# Patient Record
Sex: Female | Born: 1935
Health system: Southern US, Community
[De-identification: ages and names within clinical notes are randomized; demographics above are authoritative.]

## PROBLEM LIST (undated history)

## (undated) DIAGNOSIS — R011 Cardiac murmur, unspecified: Secondary | ICD-10-CM

## (undated) DIAGNOSIS — M25569 Pain in unspecified knee: Secondary | ICD-10-CM

## (undated) DIAGNOSIS — D219 Benign neoplasm of connective and other soft tissue, unspecified: Secondary | ICD-10-CM

## (undated) DIAGNOSIS — M722 Plantar fascial fibromatosis: Secondary | ICD-10-CM

## (undated) DIAGNOSIS — J449 Chronic obstructive pulmonary disease, unspecified: Secondary | ICD-10-CM

## (undated) DIAGNOSIS — R918 Other nonspecific abnormal finding of lung field: Secondary | ICD-10-CM

## (undated) DIAGNOSIS — K219 Gastro-esophageal reflux disease without esophagitis: Secondary | ICD-10-CM

## (undated) DIAGNOSIS — R911 Solitary pulmonary nodule: Secondary | ICD-10-CM

## (undated) DIAGNOSIS — Z8719 Personal history of other diseases of the digestive system: Secondary | ICD-10-CM

## (undated) DIAGNOSIS — M1990 Unspecified osteoarthritis, unspecified site: Secondary | ICD-10-CM

## (undated) DIAGNOSIS — E7800 Pure hypercholesterolemia, unspecified: Secondary | ICD-10-CM

## (undated) DIAGNOSIS — Z79899 Other long term (current) drug therapy: Secondary | ICD-10-CM

## (undated) DIAGNOSIS — Z8709 Personal history of other diseases of the respiratory system: Secondary | ICD-10-CM

## (undated) DIAGNOSIS — E039 Hypothyroidism, unspecified: Secondary | ICD-10-CM

## (undated) DIAGNOSIS — N898 Other specified noninflammatory disorders of vagina: Principal | ICD-10-CM

## (undated) DIAGNOSIS — K146 Glossodynia: Secondary | ICD-10-CM

## (undated) DIAGNOSIS — N952 Postmenopausal atrophic vaginitis: Principal | ICD-10-CM

## (undated) DIAGNOSIS — G43109 Migraine with aura, not intractable, without status migrainosus: Secondary | ICD-10-CM

## (undated) DIAGNOSIS — N951 Menopausal and female climacteric states: Secondary | ICD-10-CM

## (undated) DIAGNOSIS — R042 Hemoptysis: Secondary | ICD-10-CM

## (undated) HISTORY — DX: Chronic obstructive pulmonary disease, unspecified: J44.9

## (undated) HISTORY — DX: Menopausal and female climacteric states: N95.1

## (undated) HISTORY — DX: Plantar fascial fibromatosis: M72.2

## (undated) HISTORY — DX: Pain in unspecified knee: M25.569

## (undated) HISTORY — DX: Gastro-esophageal reflux disease without esophagitis: K21.9

## (undated) HISTORY — DX: Postmenopausal atrophic vaginitis: N95.2

## (undated) HISTORY — PX: COLONOSCOPY: SHX174

## (undated) HISTORY — DX: Glossodynia: K14.6

## (undated) HISTORY — DX: Pure hypercholesterolemia, unspecified: E78.00

## (undated) HISTORY — DX: Other specified noninflammatory disorders of vagina: N89.8

## (undated) HISTORY — DX: Benign neoplasm of connective and other soft tissue, unspecified: D21.9

## (undated) HISTORY — PX: UPPER GASTROINTESTINAL ENDOSCOPY: SHX188

## (undated) HISTORY — DX: Solitary pulmonary nodule: R91.1

## (undated) HISTORY — DX: Hypothyroidism, unspecified: E03.9

## (undated) HISTORY — PX: EYE SURGERY: SHX253

## (undated) HISTORY — DX: Other long term (current) drug therapy: Z79.899

---

## 1978-05-28 HISTORY — PX: ABDOMINAL HYSTERECTOMY: SHX81

## 1989-01-26 HISTORY — PX: CATARACT EXTRACTION W/ INTRAOCULAR LENS  IMPLANT, BILATERAL: SHX1307

## 2000-09-05 ENCOUNTER — Encounter: Payer: Self-pay | Admitting: Internal Medicine

## 2000-09-05 ENCOUNTER — Ambulatory Visit (HOSPITAL_COMMUNITY): Admission: RE | Admit: 2000-09-05 | Discharge: 2000-09-05 | Payer: Self-pay | Admitting: Internal Medicine

## 2000-11-07 ENCOUNTER — Encounter: Payer: Self-pay | Admitting: Otolaryngology

## 2000-11-07 ENCOUNTER — Ambulatory Visit (HOSPITAL_COMMUNITY): Admission: RE | Admit: 2000-11-07 | Discharge: 2000-11-07 | Payer: Self-pay | Admitting: Otolaryngology

## 2001-01-03 ENCOUNTER — Ambulatory Visit (HOSPITAL_COMMUNITY): Admission: RE | Admit: 2001-01-03 | Discharge: 2001-01-03 | Payer: Self-pay | Admitting: Internal Medicine

## 2001-01-10 ENCOUNTER — Ambulatory Visit (HOSPITAL_COMMUNITY): Admission: RE | Admit: 2001-01-10 | Discharge: 2001-01-10 | Payer: Self-pay | Admitting: Internal Medicine

## 2001-01-10 ENCOUNTER — Encounter (INDEPENDENT_AMBULATORY_CARE_PROVIDER_SITE_OTHER): Payer: Self-pay | Admitting: Internal Medicine

## 2001-04-29 ENCOUNTER — Ambulatory Visit (HOSPITAL_COMMUNITY): Admission: RE | Admit: 2001-04-29 | Discharge: 2001-04-29 | Payer: Self-pay | Admitting: Internal Medicine

## 2001-04-29 ENCOUNTER — Encounter: Payer: Self-pay | Admitting: Internal Medicine

## 2002-06-02 ENCOUNTER — Encounter: Payer: Self-pay | Admitting: Internal Medicine

## 2002-06-02 ENCOUNTER — Ambulatory Visit (HOSPITAL_COMMUNITY): Admission: RE | Admit: 2002-06-02 | Discharge: 2002-06-02 | Payer: Self-pay | Admitting: Internal Medicine

## 2002-06-29 ENCOUNTER — Ambulatory Visit (HOSPITAL_COMMUNITY): Admission: RE | Admit: 2002-06-29 | Discharge: 2002-06-29 | Payer: Self-pay | Admitting: Ophthalmology

## 2002-12-03 ENCOUNTER — Ambulatory Visit (HOSPITAL_COMMUNITY): Admission: RE | Admit: 2002-12-03 | Discharge: 2002-12-03 | Payer: Self-pay | Admitting: Internal Medicine

## 2002-12-15 ENCOUNTER — Encounter: Payer: Self-pay | Admitting: Internal Medicine

## 2002-12-15 ENCOUNTER — Ambulatory Visit (HOSPITAL_COMMUNITY): Admission: RE | Admit: 2002-12-15 | Discharge: 2002-12-15 | Payer: Self-pay | Admitting: Internal Medicine

## 2003-06-14 ENCOUNTER — Ambulatory Visit (HOSPITAL_COMMUNITY): Admission: RE | Admit: 2003-06-14 | Discharge: 2003-06-14 | Payer: Self-pay | Admitting: Internal Medicine

## 2004-06-15 ENCOUNTER — Ambulatory Visit (HOSPITAL_COMMUNITY): Admission: RE | Admit: 2004-06-15 | Discharge: 2004-06-15 | Payer: Self-pay | Admitting: Internal Medicine

## 2004-08-24 ENCOUNTER — Ambulatory Visit: Payer: Self-pay | Admitting: Internal Medicine

## 2005-01-16 ENCOUNTER — Ambulatory Visit: Payer: Self-pay | Admitting: Internal Medicine

## 2005-01-16 ENCOUNTER — Encounter (INDEPENDENT_AMBULATORY_CARE_PROVIDER_SITE_OTHER): Payer: Self-pay | Admitting: Internal Medicine

## 2005-01-16 ENCOUNTER — Ambulatory Visit (HOSPITAL_COMMUNITY): Admission: RE | Admit: 2005-01-16 | Discharge: 2005-01-16 | Payer: Self-pay | Admitting: Internal Medicine

## 2005-05-28 HISTORY — PX: BLADDER SUSPENSION: SHX72

## 2005-05-28 HISTORY — PX: CYSTOCELE REPAIR: SHX163

## 2005-07-02 ENCOUNTER — Ambulatory Visit (HOSPITAL_COMMUNITY): Admission: RE | Admit: 2005-07-02 | Discharge: 2005-07-02 | Payer: Self-pay | Admitting: Internal Medicine

## 2005-08-22 ENCOUNTER — Inpatient Hospital Stay (HOSPITAL_COMMUNITY): Admission: RE | Admit: 2005-08-22 | Discharge: 2005-08-24 | Payer: Self-pay | Admitting: Obstetrics and Gynecology

## 2006-02-07 ENCOUNTER — Ambulatory Visit: Payer: Self-pay | Admitting: Internal Medicine

## 2006-07-08 ENCOUNTER — Ambulatory Visit (HOSPITAL_COMMUNITY): Admission: RE | Admit: 2006-07-08 | Discharge: 2006-07-08 | Payer: Self-pay | Admitting: Internal Medicine

## 2006-08-13 ENCOUNTER — Ambulatory Visit (HOSPITAL_COMMUNITY): Admission: RE | Admit: 2006-08-13 | Discharge: 2006-08-13 | Payer: Self-pay | Admitting: Internal Medicine

## 2006-08-29 ENCOUNTER — Other Ambulatory Visit: Admission: RE | Admit: 2006-08-29 | Discharge: 2006-08-29 | Payer: Self-pay | Admitting: Obstetrics and Gynecology

## 2007-07-15 ENCOUNTER — Ambulatory Visit (HOSPITAL_COMMUNITY): Admission: RE | Admit: 2007-07-15 | Discharge: 2007-07-15 | Payer: Self-pay | Admitting: Internal Medicine

## 2007-10-13 ENCOUNTER — Encounter (HOSPITAL_COMMUNITY): Admission: RE | Admit: 2007-10-13 | Discharge: 2007-11-12 | Payer: Self-pay | Admitting: Orthopaedic Surgery

## 2008-07-19 ENCOUNTER — Ambulatory Visit (HOSPITAL_COMMUNITY): Admission: RE | Admit: 2008-07-19 | Discharge: 2008-07-19 | Payer: Self-pay | Admitting: Internal Medicine

## 2009-01-04 ENCOUNTER — Encounter: Payer: Self-pay | Admitting: Obstetrics and Gynecology

## 2009-01-04 ENCOUNTER — Other Ambulatory Visit: Admission: RE | Admit: 2009-01-04 | Discharge: 2009-01-04 | Payer: Self-pay | Admitting: Obstetrics and Gynecology

## 2009-01-04 ENCOUNTER — Ambulatory Visit: Payer: Self-pay | Admitting: Obstetrics and Gynecology

## 2009-01-11 ENCOUNTER — Ambulatory Visit: Payer: Self-pay | Admitting: Obstetrics and Gynecology

## 2009-08-11 ENCOUNTER — Ambulatory Visit (HOSPITAL_COMMUNITY): Admission: RE | Admit: 2009-08-11 | Discharge: 2009-08-11 | Payer: Self-pay | Admitting: Internal Medicine

## 2010-01-05 ENCOUNTER — Ambulatory Visit: Payer: Self-pay | Admitting: Obstetrics and Gynecology

## 2010-01-17 ENCOUNTER — Ambulatory Visit: Payer: Self-pay | Admitting: Obstetrics and Gynecology

## 2010-05-09 ENCOUNTER — Ambulatory Visit: Payer: Self-pay | Admitting: Internal Medicine

## 2010-08-15 ENCOUNTER — Other Ambulatory Visit (HOSPITAL_COMMUNITY): Payer: Self-pay | Admitting: Internal Medicine

## 2010-08-15 DIAGNOSIS — Z139 Encounter for screening, unspecified: Secondary | ICD-10-CM

## 2010-08-17 ENCOUNTER — Ambulatory Visit (HOSPITAL_COMMUNITY)
Admission: RE | Admit: 2010-08-17 | Discharge: 2010-08-17 | Disposition: A | Discharge status: Home / Self Care | Payer: Medicare Other | Source: Ambulatory Visit | Attending: Internal Medicine | Admitting: Internal Medicine

## 2010-08-17 DIAGNOSIS — Z1231 Encounter for screening mammogram for malignant neoplasm of breast: Secondary | ICD-10-CM | POA: Insufficient documentation

## 2010-08-17 DIAGNOSIS — Z139 Encounter for screening, unspecified: Secondary | ICD-10-CM

## 2010-10-13 NOTE — Op Note (Signed)
NAMEZULEICA, SEITH               ACCOUNT NO.:  [id number]   MEDICAL RECORD NO.:  [id number]          PATIENT TYPE:  INP   LOCATION:  1011                         FACILITY:  [facility]   PHYSICIAN:  Ronald L. Earlene Plater, M.D.  DATE OF BIRTH:  January 12, 1935   DATE OF PROCEDURE:  08/22/2005  DATE OF DISCHARGE:                                 OPERATIVE REPORT   DIAGNOSIS:  Stress urinary incontinence, intrinsic sphincter deficiency,  type 3.  Low leak point pressure.   OPERATIVE PROCEDURE:  Placement of a [employer] suprapubic  sling.   SURGEON:  Lucrezia Starch. Earlene Plater, M.D.   ASSISTANT:  Rande Brunt. Eda Paschal, M.D.   ANESTHESIA:  General endotracheal.   ESTIMATED BLOOD LOSS:  50 cc.   TUBES:  A 16 French Foley.   COMPLICATIONS:  None.   INDICATIONS FOR PROCEDURE:  Ms. Roberson is a lovely 76 year old white female  who presented with urinary incontinence and a cystocele.  She had a  significant cystocele that required repair.  Also had atrophic vaginitis.  Patient underwent urodynamics, which revealed a stable detrusor but had a  very low leak point pressure, signifying type 3 stress urinary incontinence.  After understanding risks, benefits and alternatives, she elected to proceed  with a sling procedure.  Dr. Eda Paschal was to perform the cystocele  concomitantly.   PROCEDURE IN DETAIL:  The patient was placed in a supine position.  After  proper general endotracheal anesthesia, was placed in the dorsal lithotomy  position.  Dr. Eda Paschal then proceeded with a cystocele repair, to be  dictated separately.  Following this, before closure of the mucosa, needles  were placed suprapubically one fingerbreadth superiorly and two  fingerbreadths lateral to the symphysis pubis midline and the punches were  carried down through the endopelvic fascia bilaterally.  A cystourethroscopy  was then performed.  The bladder was distended.  With the 70 degree lens, it  was carefully inspected, and there  were no perforations noted.  The [place]  sling was then placed into position and under the proper tension it was  visualized and felt to be in good position.  Recystoscopy was performed, and  again, no perforations were noted.  The sling material was cut at the skin  level, and the area was Dermabond closed, and turned back over to Dr.  Eda Paschal for closure of the vaginal incision.      Ronald L. Earlene Plater, M.D.  Electronically Signed    RLD/MEDQ  D:  08/22/2005  T:  08/23/2005  Job:  [id number]

## 2010-10-13 NOTE — H&P (Signed)
[name], [name]               ACCOUNT NO.:  [id number]   MEDICAL RECORD NO.:  [id number]          PATIENT TYPE:  INP   LOCATION:  NA                           FACILITY:  [facility]   PHYSICIAN:  Daniel L. Gottsegen, M.D.DATE OF BIRTH:  [birth year]   DATE OF ADMISSION:  DATE OF DISCHARGE:                                HISTORY & PHYSICAL   DATE OF SURGERY:  Wednesday, August 22, 2005 at 8:30 A.M. at [facility].   CHIEF COMPLAINT:  Cystocele with urinary stress incontinence.   HISTORY OF PRESENT ILLNESS:  The patient is a 76 year old gravida 2, para 2,  AB 0 who had presented to [name]'s office with a history of urinary  stress incontinence that occurred with coughing, laughing and sneezing.  She  had tried a variety of muscarinic drugs such as Detrol and Ditropan as well  as Kegel exercises but the problem has continued to get worse.  In June of  2006 she started to notice a large bulge as well.  The bulge bothers her,  although not quite as much as the incontinence.  She is having no other  vaginal symptoms.  She has been assessed, both by [name] and myself, for  the above and now enters the hospital for repair of her cystocele as well as  a sling procedure for correction of her urinary stress incontinence.   PAST MEDICAL HISTORY:  1.  Reveals a total abdominal hysterectomy in 1979 for fibroids in      [place].  2.  She had cataract surgery in 2000 and 2004 in [place].  3.  She is hypothyroid.  4.  She has gastroesophageal reflux disease.   CURRENT MEDICATIONS:  She takes:  1.  Synthroid 75 mcg daily.  2.  Nexium 40 mg daily.  3.  Nasonex for allergies.  4.  Estraderm 0.1 mg patch weekly.  5.  Calcium.  6.  Multivitamins.   ALLERGIES:  She is allergic to no drugs.   FAMILY HISTORY:  Reveals that her mother had uterine cancer but is otherwise  noncontributory.   SOCIAL HISTORY:  She is a nonsmoker, nondrinker.   REVIEW OF SYSTEMS:  GENERAL:   Normal.  SKIN AND BREASTS:  Normal.  ENT:  Normal.  CARDIOVASCULAR:  Negative.  RESPIRATORY:  Negative.  GASTROINTESTINAL:  Reveals a history of gastroesophageal reflux disease.  MUSCULOSKELETAL:  Negative. GENITOURINARY:  See above.  NEUROLOGICAL/PSYCHIATRIC:  Negative.  ALLERGIC/IMMUNOLOGICAL/LYMPHATIC/ENDOCRINE:  Reveals a history of allergies  on Nasonex and hypothyroidism on Synthroid.   PHYSICAL EXAMINATION:  GENERAL APPEARANCE:  Patient is a well-developed,  well-nourished female in no acute distress.  VITAL SIGNS:  Blood pressure is 116/74, pulse 80 and regular, respirations  16 and nonlabored.  She is afebrile.  HEENT:  All within normal limits.  NECK:  Supple.  Trachea in midline.  Thyroid is not enlarged.  LUNGS:  Clear to auscultation and percussion.  HEART:  No thrills, heaves or murmurs.  BREASTS:  No masses.  ABDOMEN:  Soft without guarding, rebound or masses.  PELVIC:  External genitalia is normal. BUS  is normal. Bladder reveals a  third degree cystocele with loss of urethral-vesical angle.  Vagina is well  supported without enterocele or rectocele.  Cervix and uterus are absent.  Adnexa fail to reveal masses.  Anus and perineum normal.  Digital rectal  examination is negative.   ADMISSION IMPRESSION:  1.  Significant cystocele.  2.  Urinary stress incontinence.   PLAN:  See above.      Daniel L. Eda Paschal, M.D.  Electronically Signed     DLG/MEDQ  D:  08/21/2005  T:  08/21/2005  Job:  [id number]

## 2010-10-13 NOTE — Op Note (Signed)
NAME:  [name], [name]                         ACCOUNT NO.:  [id number]   MEDICAL RECORD NO.:  [id number]                   PATIENT TYPE:  AMB   LOCATION:  DAY                                  FACILITY:  [facility]   PHYSICIAN:  Lionel December, M.D.                 DATE OF BIRTH:  Jan 21, 1935   DATE OF PROCEDURE:  12/03/2002  DATE OF DISCHARGE:                                 OPERATIVE REPORT   PROCEDURE:  Esophagogastroduodenoscopy with esophageal dilatation followed  by colonoscopy.   INDICATIONS FOR PROCEDURE:  [name] is a 76 year old Caucasian female with  chronic GERD who also has short-segment [name]'s esophagus resulting in  solid food dysphagia.  She is on antireflux measures, Aciphex, p.r.n. use of  Tums for control of her GERD symptoms.  She is also undergoing screening  colonoscopy.  Her last EGD was in August of 2002.  The procedure was  reviewed with the patient, and informed consent was obtained.   PREOPERATIVE MEDICATIONS:  Cetacaine spray for pharyngeal topical  anesthesia, Demerol 25 mg IV, Versed 10 mg IV in divided doses.   FINDINGS:  The procedure was performed in the endoscopy suite.  The  patient's vital signs and O2 saturations were monitored during the procedure  and remained stable.   PROCEDURE #1 - ESOPHAGOGASTRODUODENOSCOPY:  The patient was placed in the  left lateral recumbent position.  The endoscope was passed via the  oropharynx without any difficulty into the esophagus.   Esophagus:  The mucosa of the esophagus was normal, except distally there  were two islands of gastric-type mucosa and a 15-mm tongue of [name]'s  mucosa at the proximal GE junction.  She had a ring at the GE junction and a  3-cm size sliding hiatal hernia.   Stomach:  It was empty and distended very well with insufflation.  The folds  of the proximal stomach were normal.  Examination of the mucosa at the body,  antrum, pyloric channel, as well as angularis, fundus, and cardia  was  normal.   Duodenum:  Examination of the bulb and second part of the duodenum was  normal.  The endoscope was withdrawn.   The esophagus was dilated by passing a 54 [place] Maloney dilator.  As  dilation was continued, the endoscope was passed again, and there was a  linear boat-shaped tear at the mid proximal esophagus,  implying tubular  narrowing in this segment which was not obvious initially.  There was a  small tear at the GE junction as well.  The endoscope was withdrawn, and the  patient was prepared for procedure #2.   PROCEDURE #2 - COLONOSCOPY:  Rectal examination was performed.  No  abnormality noted on external or digital exam.  The scope was placed into  the rectum and advanced to the region of the sigmoid colon and beyond.  The  preparation was satisfactory.  She had a few  small diverticula at the  sigmoid colon.  The scope was passed to the cecum which was identified by  the appendiceal orifice and ileocecal valve.  As the scope was withdrawn,  the colonic mucosa was once again carefully examined.  There were no polyps  or other abnormalities.  The rectal mucosa similarly was normal.  The scope  was retroflexed to examine the anorectal junction which was unremarkable.  The endoscope was straightened and withdrawn.  The patient tolerated the  procedure well.   FINAL DIAGNOSES:  1. Single tract of [name]'s-type mucosa at 15 mm in length along with two     islands of gastric-type mucosa.  No biopsies taken today.  2. Distal esophageal ring and tubular narrowing to the esophagus which was     more apparent after esophageal dilatation which resulted in tearing of     the proximal to mid segment as well as gastroesophageal junction.  3. Normal examination of the stomach and second part of the duodenum.  4. Normal colonoscopy, except for a few small diverticula at sigmoid colon.   RECOMMENDATIONS:  1. Soft diet for two days.  2. She will continue antireflux measures  and Aciphex as before.  3. High fiber diet.  4. She will return for office visit in one year from now.                                               Lionel December, M.D.    NR/MEDQ  D:  12/03/2002  T:  12/03/2002  Job:  [id number]   cc:   Kingsley Callander. Ouida Sills, M.D.  [address]  [place]  [place] [address]  Fax: [phone]

## 2010-10-13 NOTE — Op Note (Signed)
[name], [name]               ACCOUNT NO.:  [id number]   MEDICAL RECORD NO.:  [id number]          PATIENT TYPE:  INP   LOCATION:  1011                         FACILITY:  [facility]   PHYSICIAN:  Daniel L. Gottsegen, M.D.DATE OF BIRTH:  1934/11/30   DATE OF PROCEDURE:  08/22/2005  DATE OF DISCHARGE:                                 OPERATIVE REPORT   PRE-AND-POSTOPERATIVE DIAGNOSIS:  1.  Cystocele.  2.  Urinary stress incontinence.  Operation/> Anterior Repair and Sling Procedure   SURGEONS:  Daniel L. Gottsegen, M.D./Gary B. Earlene Plater, M.D.   ANESTHESIA:  General endotracheal.   FINDINGS AT THE TIME OF SURGERY:  The patient had a second-degree cystocele.  She had good vault support and no rectocele nor enterocele.  She did have  loss of urethrovesical angle.   PROCEDURE:  After adequate general endotracheal anesthesia, the patient was  placed in the dorsal supine position, prepped and draped in the usual  sterile manner.  The top of the cuff was identified; and an inverted T  incision was made to the level right below the urethra.  The perivesical  fascia along with the cystocele was dissected free from the vaginal mucosa.  At this point, at Dr. Jinny Sanders direction we elected to reduce the cystocele  first so that when the sling was placed the bladder would be and the correct  anatomical position.  The cystocele was reduced by bringing together vesicle  fascia with interrupted 2-0 Vicryl.  It took approximately 9-10 sutures to  bring it together.  At this point [name] did a sling which is dictated on  a separate operative note.  After he finished, his part of the procedure,  the anterior vaginal mucosa was closed.  It was closed with a 2-0 Vicryl  that ran; it picked up the perivesical fascia in addition to the mucosa to  eliminate dead space and to reinforce the repair.  Following this the vagina  was packed.  She already had a Foley catheter in that was draining clear  urine.   She left the operating room in satisfactory condition.  Blood loss  was minimal.      Reuel Boom L. Eda Paschal, M.D.  Electronically Signed     DLG/MEDQ  D:  08/22/2005  T:  08/23/2005  Job:  [id number]

## 2010-10-13 NOTE — Op Note (Signed)
NAMEHAILLEY, [name]               ACCOUNT NO.:  [id number]   MEDICAL RECORD NO.:  [id number]          PATIENT TYPE:  AMB   LOCATION:  DAY                           FACILITY:  [facility]   PHYSICIAN:  Lionel December, M.D.    DATE OF BIRTH:  April 10, 1935   DATE OF PROCEDURE:  01/16/2005  DATE OF DISCHARGE:                                 OPERATIVE REPORT   PROCEDURE:  Esophagogastroduodenoscopy with gastric polypectomy and  esophageal dilation.   INDICATION:  Autry is a 76 year old Caucasian female with chronic GERD  complicated by short segment [name]'s esophagus who is here for  surveillance exam, but she is also complaining of intermittent solid food  dysphagia. She has history of proximal esophageal stricture which was last  dilated to 54-French in July 2004. Procedure risks were reviewed with the  patient, abdominal discomfort informed consent was obtained. She states her  heartburn is well-controlled with therapy.   PREMEDICATION:  Cetacaine spray for pharyngeal topical anesthesia, Demerol  50 mg IV, Versed 7 mg IV.   FINDINGS:  Procedure performed in endoscopy suite. The patient's vital signs  and O2 saturation were monitored during the procedure and remained stable.  The patient was placed in left lateral position. Olympus videoscope was  passed via oropharynx without any difficulty into esophagus.   Esophagus. Mucosa of the esophagus normal. No obvious luminal narrowing was  noted to proximal esophagus. GE junction was at 38 cm. She had three islands  of salmon-colored mucosa, one between 10 and 11 o'clock was about 1-cm wide  and 12-cm long. The other two patches were much smaller in length and  diameter. The GE junction was at 38 and hiatus at 39.   Stomach. It was empty and distended very well insufflation. Folds of  proximal stomach were normal. Examination revealed multiple small polyps at  the proximal gastric body; the largest one was about 8-10 mm which was  snared  and retrieved for histologic exam. Four other small polyps were  biopsied and submitted in one container. Mucosa at antrum body angularis,  fundus, cardia was normal. Mucosa at the angularis, pylorus, cardia was  normal. Two small polyps were also noted at fundus, one of each were  biopsied as well.   Duodenum. Bulbar mucosa was normal. Scope was passed to the second part of  the duodenum where mucosa and folds were normal.   This polyp at gastric body was snared. There was good hemostasis at  polypectomy site. This polyp was caught with a Dormia basket and was  withdrawn.   Esophagus was dilated by passing 52 and 54-French Maloney dilators. No  resistance noted with these dilators. Endoscope was passed again, and there  was a superficial 2-cm long boat-shaped tear at proximal esophagus starting  at 20 cm from the incisors down to 22. Pictures taken for the record. On the  way out, biopsy was taken from [name]'s mucosa for routine histology.  Endoscope was withdrawn. The patient tolerated the procedure well.   FINAL DIAGNOSIS:  1.  Multiple findings.  2.  Esophageal stricture at proximal esophagus was dilated  to 54-French.  3.  Short segment [name]'s esophagus which was biopsied for histology.  4.  Small sliding hiatal hernia.  5.  Multiple gastric polyps, most of which were small. One was about 8-10 mm      and was snared. Four others were biopsied for histology. Endoscopically,      these appeared to be hyperplastic.   RECOMMENDATIONS:  She will continue antireflux measures, Nexium at 40 mg  steel t.i.d. Will check office records, and if she has not had H pylori  serology, it would be done at a later date.   I will be contacting the patient with biopsy results and further  recommendations.      Lionel December, M.D.  Electronically Signed     NR/MEDQ  D:  01/16/2005  T:  01/16/2005  Job:  [id number]

## 2010-10-13 NOTE — Discharge Summary (Signed)
NAMEMATTELYN, IMHOFF               ACCOUNT NO.:  [id number]   MEDICAL RECORD NO.:  [id number]          PATIENT TYPE:  INP   LOCATION:  1011                         [facility]ACILITY:  [facility]   PHYSICIAN:  Daniel L. Gottsegen, M.D.DATE OF BIRTH:  [birth year]   DATE OF ADMISSION:  08/22/2005  DATE OF DISCHARGE:  08/24/2005                                 DISCHARGE SUMMARY   Patient is a 76 year old female who was admitted to the hospital with  cystocele and urinary stress incontinence for definitive surgery.  On the  day of admission, she was taken to the operating room.  An anterior repair  and sling procedure were performed by Dr. Eda Paschal and [name].  Postoperatively, she did well.  On the second postoperative day, her  catheter was removed, and she voided without trouble.  She was discharged on  Septra DS for four additional days.  She will be seen by [name] in two  weeks and Dr. Eda Paschal in three weeks.   CONDITION ON DISCHARGE:  Improved.   DISCHARGE DIAGNOSES:  1.  Symptomatic cystocele.  2.  Urinary stress incontinence.   OPERATION:  Anterior repair with sling.      Daniel L. Eda Paschal, M.D.  Electronically Signed     DLG/MEDQ  D:  08/24/2005  T:  08/26/2005  Job:  [id number]

## 2010-12-28 ENCOUNTER — Encounter: Payer: Self-pay | Admitting: Gynecology

## 2011-01-08 ENCOUNTER — Ambulatory Visit (INDEPENDENT_AMBULATORY_CARE_PROVIDER_SITE_OTHER): Payer: Medicare Other | Admitting: Obstetrics and Gynecology

## 2011-01-08 ENCOUNTER — Other Ambulatory Visit (HOSPITAL_COMMUNITY)
Admission: RE | Admit: 2011-01-08 | Discharge: 2011-01-08 | Disposition: A | Discharge status: Home / Self Care | Payer: Medicare Other | Source: Ambulatory Visit | Attending: Obstetrics and Gynecology | Admitting: Obstetrics and Gynecology

## 2011-01-08 ENCOUNTER — Encounter: Payer: Medicare Other | Admitting: Obstetrics and Gynecology

## 2011-01-08 ENCOUNTER — Encounter: Payer: Self-pay | Admitting: Obstetrics and Gynecology

## 2011-01-08 VITALS — BP 116/74 | Ht 61.5 in | Wt 154.0 lb

## 2011-01-08 DIAGNOSIS — Z780 Asymptomatic menopausal state: Secondary | ICD-10-CM

## 2011-01-08 DIAGNOSIS — N951 Menopausal and female climacteric states: Secondary | ICD-10-CM

## 2011-01-08 DIAGNOSIS — Z124 Encounter for screening for malignant neoplasm of cervix: Secondary | ICD-10-CM | POA: Insufficient documentation

## 2011-01-08 DIAGNOSIS — Z01419 Encounter for gynecological examination (general) (routine) without abnormal findings: Secondary | ICD-10-CM

## 2011-01-08 DIAGNOSIS — L293 Anogenital pruritus, unspecified: Secondary | ICD-10-CM

## 2011-01-08 DIAGNOSIS — N898 Other specified noninflammatory disorders of vagina: Secondary | ICD-10-CM

## 2011-01-08 DIAGNOSIS — N952 Postmenopausal atrophic vaginitis: Secondary | ICD-10-CM

## 2011-01-08 DIAGNOSIS — N393 Stress incontinence (female) (male): Secondary | ICD-10-CM

## 2011-01-08 MED ORDER — FLUCONAZOLE 200 MG PO TABS: 200.0000 mg | ORAL_TABLET | Freq: Every day | ORAL | Status: AC

## 2011-01-08 MED ORDER — ESTRADIOL 0.05 MG/24HR TD PTTW: 1.0000 | MEDICATED_PATCH | TRANSDERMAL | Status: DC

## 2011-01-08 NOTE — Progress Notes (Signed)
The patient came to see me today for followup. She had a very severe infection of her finger. This required antibiotics will month of June. She is now being vulvar and vaginal itching. She is up-to-date on mammograms and bone densities. She remains on hormone replacement therapy for both menopausal symptoms of atrophic vaginitis. She is doing well with bladder control since she had her sling. She will occasionally have some loss of urine. She is having of dysuria or urgency. She is having no vaginal bleeding. She is having no pelvic pain.  Past medical history, family history, and review of social history all done and chart.  9 point review of systems done and only pertinent positives noted above  HEENT: Within normal limits. Neck: No masses. Supraclavicular lymph nodes: Not enlarged. Breasts: Examined in both sitting and lying position. Symmetrical without skin changes or masses. Abdomen: Soft no masses guarding or rebound. No hernias. Pelvic: External within normal limits. BUS within normal limits. Vaginal examination shows good estrogen effect, no cystocele enterocele or rectocele. Cervix and uterus absent. Adnexa within normal limits. Rectovaginal confirmatory. Extremities within normal limits.   Assessment: 1. Yeast vaginitis 2. Menopausal symptoms 3. Atrophic vaginitis 4. Urinary incontinence much better after sling  Plan: 1. Diflucan 200 mg daily for 3 days 2. Had a very long discussion about estrogen replacement therapy. Discussed that using a patch is much safer. She will continue her patch as above. 3. Because of the expense of her estrogen cream. We have elected to switch her to estradiol cream 0.02% at [facility]. 4. Discussed preventative measures or cystic on antibiotics again including oral refresh.

## 2011-05-29 HISTORY — PX: SKIN CANCER EXCISION: SHX779

## 2011-06-04 ENCOUNTER — Encounter (INDEPENDENT_AMBULATORY_CARE_PROVIDER_SITE_OTHER): Payer: Self-pay | Admitting: Internal Medicine

## 2011-06-04 ENCOUNTER — Ambulatory Visit (INDEPENDENT_AMBULATORY_CARE_PROVIDER_SITE_OTHER): Payer: Medicare Other | Admitting: Internal Medicine

## 2011-06-04 VITALS — BP 110/74 | HR 76 | Temp 98.6°F | Resp 12 | Ht 62.0 in | Wt 157.0 lb

## 2011-06-04 DIAGNOSIS — K219 Gastro-esophageal reflux disease without esophagitis: Secondary | ICD-10-CM | POA: Insufficient documentation

## 2011-06-04 DIAGNOSIS — E039 Hypothyroidism, unspecified: Secondary | ICD-10-CM | POA: Insufficient documentation

## 2011-06-04 DIAGNOSIS — M722 Plantar fascial fibromatosis: Secondary | ICD-10-CM | POA: Insufficient documentation

## 2011-06-04 DIAGNOSIS — K589 Irritable bowel syndrome without diarrhea: Secondary | ICD-10-CM

## 2011-06-04 MED ORDER — ESOMEPRAZOLE MAGNESIUM 40 MG PO CPDR: 40.0000 mg | DELAYED_RELEASE_CAPSULE | Freq: Every day | ORAL | Status: DC

## 2011-06-04 NOTE — Patient Instructions (Signed)
Decrease Metamucil 2 half of the dose that she were taking now. Gaviscon chew 1-2 tablets at bedtime daily for 2 weeks and thereafter on an as-needed basis. Can take Imodium 1-2 mg every morning on as needed.

## 2011-06-04 NOTE — Progress Notes (Signed)
Presenting complaint; Followup for chronic GERD. Patient also complains of frequent bowel movements. Subjective: Emmersyn is 77 year old Caucasian female who is here for yearly visit. She's had symptoms of GERD for many years she's had EGD in 2002, 2004 and most recently in 2006. Biopsy from distal esophagus was negative for [name]'s. She does not experience any heartburn or regurgitation during the daytime. However she wakes up around 4 AM with burning sensation in her retrosternal area. This may occur daily for couple of days then she may have weeks without it. She denies dysphagia cough sore throat or hoarseness. She does complain burning in her mouth which she believes this do to burning mouth syndrome; she also complains of intermittent dry mouth. She recently had to take antibiotic for one month and it caused stomach upset. She is doing some better since she went on probiotic few days ago. She is having at least 3-5 bowel movements per day. Most of her stools are soft. She has urgency but has not had any accidents. Quite often she has bowel movement with urination. She denies abdominal pain melena or rectal bleeding. Her appetite is good. She has gained 10 pounds since her last visit but she states this is 5 pounds more than what she was on her home scale. She is walking less because of plantar fasciitis. She does not take Celebrex very often. Patient's last colonoscopy was in July 2004 and she would like to get her next screening exam this year. Current Medications: Current Outpatient Prescriptions  Medication Sig Dispense Refill  . Calcium Carbonate-Vitamin D (CALCIUM + D PO) Take by mouth 2 (two) times daily.        . CELEBREX 200 MG capsule 200 mg as needed.       [name] esomeprazole (NEXIUM) 40 MG capsule Take 1 capsule (40 mg total) by mouth daily.  30 capsule  11  . estradiol (VIVELLE-DOT) 0.05 MG/24HR Place 1 patch (0.05 mg total) onto the skin 2 (two) times a week.  24 patch  3  . ESTRADIOL VA  Place 1 applicator vaginally. Twice a week      . FIBER PO Take by mouth daily.       [name] levothyroxine (SYNTHROID, LEVOTHROID) 75 MCG tablet Take 75 mcg by mouth daily.        . Loratadine (CLARITIN PO) Take by mouth as needed.        . Misc Natural Products (TART CHERRY ADVANCED) CAPS Take by mouth. Patient takes this Black Cherry 250 mg Capsule twice daily       . mometasone (NASONEX) 50 MCG/ACT nasal spray Place 2 sprays into the nose as needed.        . Multiple Vitamin (MULTIVITAMIN) capsule Take 1 capsule by mouth daily.        . Probiotic Product (TRUBIOTICS PO) Take by mouth daily.          Objective: BP 110/74  Pulse 76  Temp(Src) 98.6 F (37 C) (Oral)  Resp 12  Ht 5\' 2"  (1.575 m)  Wt 157 lb (71.215 kg)  BMI 28.72 kg/m2  Conjunctiva is pink. Sclera is nonicteric Oral pharyngeal mucosa is normal. No neck masses or thyromegaly noted.. Abdomen is soft and nontender without organomegaly or masses. No LE edema or clubbing noted.  Assessment: #1. Chronic GERD. She is having intermittent nocturnal symptoms. She does not have any alarm symptoms. #2. Increased frequency of defecation. Change in bowel habits no be secondary to use of antibiotic for extended time.  She may have IBS. If the symptoms persist we may consider colonoscopy and had a schedule otherwise later this year.    Plan: New prescription for Nexium 40 mg by mouth every morning given for 30 days with 11 refills. Decrease Metamucil to  Half a  tablespoonful daily. Gaviscon 1-2 tablets to be chewed at bedtime daily for 2 weeks thereafter on an as-needed basis. Imodium 1-2 mg by mouth daily when necessary. Will plan screening colonoscopy fall of this year. Will need progress report in one month

## 2011-06-05 ENCOUNTER — Telehealth: Payer: Self-pay | Admitting: *Deleted

## 2011-06-05 NOTE — Telephone Encounter (Signed)
Pt informed with the below note and will continue to take the medication.

## 2011-06-05 NOTE — Telephone Encounter (Signed)
Although some of the medicine comes out I believe she probably keeps enough in to be therapeutic. As long as she is asymptomatic I would tell her not to worry and continue the medication. I could give her a vaginal suppository instead but would be much more expensive

## 2011-06-05 NOTE — Telephone Encounter (Signed)
Pt has been taking estradiol 0.02 % since august 2012, and medicine works great, but lately she has had and issue with the medication staying inside vaginally. Pt said when going to bathroom medication comes out in the toilet, she is taking medication at night as directed. Pt wants to know what should be done, she really like medicine? Please advise

## 2011-08-06 ENCOUNTER — Other Ambulatory Visit: Payer: Self-pay | Admitting: Obstetrics and Gynecology

## 2011-08-06 DIAGNOSIS — Z139 Encounter for screening, unspecified: Secondary | ICD-10-CM

## 2011-08-27 ENCOUNTER — Ambulatory Visit (HOSPITAL_COMMUNITY)
Admission: RE | Admit: 2011-08-27 | Discharge: 2011-08-27 | Disposition: A | Discharge status: Home / Self Care | Payer: Medicare Other | Source: Ambulatory Visit | Attending: Obstetrics and Gynecology | Admitting: Obstetrics and Gynecology

## 2011-08-27 DIAGNOSIS — Z139 Encounter for screening, unspecified: Secondary | ICD-10-CM

## 2011-08-27 DIAGNOSIS — Z1231 Encounter for screening mammogram for malignant neoplasm of breast: Secondary | ICD-10-CM | POA: Insufficient documentation

## 2011-10-25 ENCOUNTER — Other Ambulatory Visit (HOSPITAL_COMMUNITY): Payer: Self-pay | Admitting: Internal Medicine

## 2011-10-25 DIAGNOSIS — I719 Aortic aneurysm of unspecified site, without rupture: Secondary | ICD-10-CM

## 2011-10-29 ENCOUNTER — Other Ambulatory Visit (HOSPITAL_COMMUNITY): Payer: Self-pay | Admitting: Internal Medicine

## 2011-10-29 ENCOUNTER — Ambulatory Visit (HOSPITAL_COMMUNITY)
Admission: RE | Admit: 2011-10-29 | Discharge: 2011-10-29 | Disposition: A | Discharge status: Home / Self Care | Payer: Medicare Other | Source: Ambulatory Visit | Attending: Internal Medicine | Admitting: Internal Medicine

## 2011-10-29 DIAGNOSIS — I719 Aortic aneurysm of unspecified site, without rupture: Secondary | ICD-10-CM

## 2011-12-13 ENCOUNTER — Other Ambulatory Visit: Payer: Self-pay | Admitting: Obstetrics and Gynecology

## 2011-12-13 DIAGNOSIS — Z780 Asymptomatic menopausal state: Secondary | ICD-10-CM

## 2011-12-13 MED ORDER — ESTRADIOL 0.05 MG/24HR TD PTTW: 1.0000 | MEDICATED_PATCH | TRANSDERMAL | Status: DC

## 2011-12-13 NOTE — Telephone Encounter (Signed)
Patient has her yearly exam scheduled for Aug 2013.

## 2011-12-28 ENCOUNTER — Ambulatory Visit (HOSPITAL_COMMUNITY)
Admission: RE | Admit: 2011-12-28 | Discharge: 2011-12-28 | Disposition: A | Discharge status: Home / Self Care | Payer: Medicare Other | Source: Ambulatory Visit | Attending: Internal Medicine | Admitting: Internal Medicine

## 2011-12-28 ENCOUNTER — Other Ambulatory Visit (HOSPITAL_COMMUNITY): Payer: Self-pay | Admitting: Internal Medicine

## 2011-12-28 DIAGNOSIS — R042 Hemoptysis: Secondary | ICD-10-CM

## 2011-12-28 DIAGNOSIS — R918 Other nonspecific abnormal finding of lung field: Secondary | ICD-10-CM | POA: Insufficient documentation

## 2012-01-01 ENCOUNTER — Other Ambulatory Visit (HOSPITAL_COMMUNITY): Payer: Self-pay | Admitting: Internal Medicine

## 2012-01-01 DIAGNOSIS — R9389 Abnormal findings on diagnostic imaging of other specified body structures: Secondary | ICD-10-CM

## 2012-01-03 ENCOUNTER — Ambulatory Visit (HOSPITAL_COMMUNITY)
Admission: RE | Admit: 2012-01-03 | Discharge: 2012-01-03 | Disposition: A | Discharge status: Home / Self Care | Payer: Medicare Other | Source: Ambulatory Visit | Attending: Internal Medicine | Admitting: Internal Medicine

## 2012-01-03 DIAGNOSIS — R9389 Abnormal findings on diagnostic imaging of other specified body structures: Secondary | ICD-10-CM

## 2012-01-03 DIAGNOSIS — R042 Hemoptysis: Secondary | ICD-10-CM | POA: Insufficient documentation

## 2012-01-03 DIAGNOSIS — R911 Solitary pulmonary nodule: Secondary | ICD-10-CM | POA: Insufficient documentation

## 2012-01-09 ENCOUNTER — Encounter: Payer: Self-pay | Admitting: Obstetrics and Gynecology

## 2012-01-09 ENCOUNTER — Ambulatory Visit (INDEPENDENT_AMBULATORY_CARE_PROVIDER_SITE_OTHER): Payer: Medicare Other | Admitting: Obstetrics and Gynecology

## 2012-01-09 VITALS — BP 120/72 | Ht 61.5 in | Wt 156.0 lb

## 2012-01-09 DIAGNOSIS — R911 Solitary pulmonary nodule: Secondary | ICD-10-CM | POA: Insufficient documentation

## 2012-01-09 DIAGNOSIS — N3941 Urge incontinence: Secondary | ICD-10-CM

## 2012-01-09 DIAGNOSIS — L293 Anogenital pruritus, unspecified: Secondary | ICD-10-CM

## 2012-01-09 DIAGNOSIS — N952 Postmenopausal atrophic vaginitis: Secondary | ICD-10-CM

## 2012-01-09 DIAGNOSIS — Z780 Asymptomatic menopausal state: Secondary | ICD-10-CM

## 2012-01-09 DIAGNOSIS — N898 Other specified noninflammatory disorders of vagina: Secondary | ICD-10-CM

## 2012-01-09 LAB — WET PREP FOR TRICH, YEAST, CLUE
Clue Cells Wet Prep HPF POC: NONE SEEN
Trich, Wet Prep: NONE SEEN
Yeast Wet Prep HPF POC: NONE SEEN

## 2012-01-09 MED ORDER — FLUCONAZOLE 150 MG PO TABS: 150.0000 mg | ORAL_TABLET | Freq: Every day | ORAL | Status: AC

## 2012-01-09 MED ORDER — ESTRADIOL 0.05 MG/24HR TD PTTW: 1.0000 | MEDICATED_PATCH | TRANSDERMAL | Status: DC

## 2012-01-09 NOTE — Progress Notes (Signed)
Patient came to see me today for further followup. She had been on antibiotic for upper respiratory infection and developed vaginal itching. She took a single Diflucan and is better but is not completely gone. She continues to use custom care compounded vaginal estrogen cream that I give her with excwellent results. She is also on Vivelle-Dot patches for her hot flashes with excellent results and does not want to stop them. She has had a sling procedure but is now having occasional urgency incontinence. She has no nocturia, dysuria, stress incontinence. She is up-to-date on mammograms. She had normal bone density in  2010. She is having no vaginal bleeding. She is having no pelvic pain. She had a previous total abdominal hysterectomy for fibroids. She has never had an abnormal Pap smear. Her last Pap smear was 2012.  ROS: 12 systems reviewed done. Pertinent positives above. Other positives include GERD, hypothyroidismand irritable bowel syndrome. This year she was diagnosed with a lesion in her upper lobe of her lung. It has not been biopsied and is just being watched.  HEENT: Within normal limits.Kennon Portela present Neck: No masses. Supraclavicular lymph nodes: Not enlarged. Breasts: Examined in both sitting and lying position. Symmetrical without skin changes or masses. Abdomen: Soft no masses guarding or rebound. No hernias. Pelvic: External within normal limits. BUS within normal limits. Vaginal examination shows good estrogen effect, no cystocele enterocele or rectocele. Slight discharge consistent with yeast. Wet prep negative. Cervix and uterus absent. Adnexa within normal limits. Rectovaginal confirmatory. Extremities within normal limits..  Assessment: #1. Yeast vaginitis #2. Menopausal symptoms #3. Atrophic vaginitis #4. Urgency incontinence.  Plan: Diflucan 150 mg daily for 2 more days. Continue Vivelle dot patch. We had our  usual discussion of benefits and risks of HRT . Continue  estradiol vaginal cream 0.02% 1 mL prefilled applicators 3 times a week. Offered medication for urgency incontinence. Patient declined. Continue yearly mammograms.The new Pap smear guidelines were discussed with the patient. No pap done.

## 2012-01-09 NOTE — Patient Instructions (Signed)
Call if need for medication for urgency of urination.

## 2012-01-10 ENCOUNTER — Ambulatory Visit (INDEPENDENT_AMBULATORY_CARE_PROVIDER_SITE_OTHER): Payer: Medicare Other | Admitting: Otolaryngology

## 2012-01-10 DIAGNOSIS — R070 Pain in throat: Secondary | ICD-10-CM

## 2012-01-10 DIAGNOSIS — K219 Gastro-esophageal reflux disease without esophagitis: Secondary | ICD-10-CM

## 2012-01-14 ENCOUNTER — Encounter (INDEPENDENT_AMBULATORY_CARE_PROVIDER_SITE_OTHER): Payer: Self-pay | Admitting: Internal Medicine

## 2012-01-14 ENCOUNTER — Ambulatory Visit (INDEPENDENT_AMBULATORY_CARE_PROVIDER_SITE_OTHER): Payer: Medicare Other | Admitting: Internal Medicine

## 2012-01-14 ENCOUNTER — Other Ambulatory Visit (INDEPENDENT_AMBULATORY_CARE_PROVIDER_SITE_OTHER): Payer: Self-pay | Admitting: *Deleted

## 2012-01-14 ENCOUNTER — Telehealth (INDEPENDENT_AMBULATORY_CARE_PROVIDER_SITE_OTHER): Payer: Self-pay | Admitting: *Deleted

## 2012-01-14 VITALS — BP 96/50 | HR 76 | Temp 97.6°F | Ht 62.0 in | Wt 156.4 lb

## 2012-01-14 DIAGNOSIS — R911 Solitary pulmonary nodule: Secondary | ICD-10-CM

## 2012-01-14 DIAGNOSIS — K219 Gastro-esophageal reflux disease without esophagitis: Secondary | ICD-10-CM

## 2012-01-14 DIAGNOSIS — Z1211 Encounter for screening for malignant neoplasm of colon: Secondary | ICD-10-CM

## 2012-01-14 MED ORDER — PEG 3350-KCL-NA BICARB-NACL 420 G PO SOLR: 4000.0000 mL | Freq: Once | ORAL | Status: AC

## 2012-01-14 NOTE — Telephone Encounter (Signed)
Patient needs trilyte 

## 2012-01-14 NOTE — Patient Instructions (Addendum)
Continue the Nexium. EGD/Colonoscopy

## 2012-01-14 NOTE — Progress Notes (Signed)
Subjective:     Patient ID: [name], female   DOB: 1934-12-14, 77 y.o.   MRN: [id number]  HPI  [name] is a 7 is here today for f/u of her chronic GERD.  She tells me she feels a lump in her throat.  When she clears her throat she sometimes sees  Blood. She says it was black colored. This was after having her one acrylic nail done just before the fourth of July. [name]  Her PCP  was covered with Cipro x 10 days. Then she had a chest xray.  She says she had a nodule in her rt upper lobe.  She underwent a CT scan of the chest this month which revealed: IMPRESSION:  1. Subsolid nodule in the apex of the right upper lobe measuring  14 x 9 mm (central solid component 10 mm in greatest length). This  is suspicious for, but not definitive for, a small adenocarcinoma;  although an area of scarring could have a similar appearance. At  this time, a follow-up CT scan of the thorax in 3 months is  recommended to confirm persistence or document resolution. This  recommendation follows the consensus statement: Recommendations for  the Management of Subsolid Pulmonary Nodules Detected at CT: A  Statement from the Fleischner Society as published in Radiology  2013; 266:304-317.  2. Atherosclerosis, including left anterior descending coronary  artery disease.  3. Levoscoliosis of the thoracic spine. She saw Dr. Suszanne Conners and he did a laryngoscopy which revealed swelling to her larynx which he told her was from acid reflux.  She tells me at night she can tell she has acid reflux.  She says when she clears her throat she still has a small amount of blood. No dysphagia. She tells me she is actually better.   12/03/2002 EGD/Colonoscopy: FINAL DIAGNOSES:  1. Single tract of [name]'s-type mucosa at 15 mm in length along with two  islands of gastric-type mucosa. No biopsies taken today.  2. Distal esophageal ring and tubular narrowing to the esophagus which was  more apparent after esophageal dilatation which  resulted in tearing of  the proximal to mid segment as well as gastroesophageal junction.  3. Normal examination of the stomach and second part of the duodenum.  4. Normal colonoscopy, except for a few small diverticula at sigmoid colon.   Review of Systems see hpi Current Outpatient Prescriptions  Medication Sig Dispense Refill  . CELEBREX 200 MG capsule 200 mg as needed.       [name] esomeprazole (NEXIUM) 40 MG capsule Take 1 capsule (40 mg total) by mouth daily.  30 capsule  11  . estradiol (VIVELLE-DOT) 0.05 MG/24HR Place 1 patch (0.05 mg total) onto the skin 2 (two) times a week.  26 patch  3  . ESTRADIOL VA Place 1 applicator vaginally. Twice a week      . FIBER PO Take by mouth daily.       [name] levothyroxine (SYNTHROID, LEVOTHROID) 75 MCG tablet Take 75 mcg by mouth daily.        . Loratadine (CLARITIN PO) Take by mouth as needed.        . Misc Natural Products (TART CHERRY ADVANCED) CAPS Take by mouth. Patient takes this Black Cherry 250 mg Capsule twice daily       . mometasone (NASONEX) 50 MCG/ACT nasal spray Place 2 sprays into the nose as needed.        . Multiple Vitamin (MULTIVITAMIN) capsule Take 1 capsule by mouth  daily.        . Probiotic Product (TRUBIOTICS PO) Take by mouth daily.         Past Medical History  Diagnosis Date  . Urinary incontinence   . Cystocele   . Fibroid   . GERD (gastroesophageal reflux disease)   . Lesion of right lung    History   Social History  . Marital Status: Married    Spouse Name: N/A    Number of Children: N/A  . Years of Education: N/A   Occupational History  . Not on file.   Social History Main Topics  . Smoking status: Former Smoker    Types: Cigarettes    Quit date: 06/03/1976  . Smokeless tobacco: Never Used   Comment: Patient smoked  2 or more packs a day  . Alcohol Use: Yes     occasional wine  . Drug Use: No  . Sexually Active: No   Other Topics Concern  . Not on file   Social History Narrative  . No narrative on  file   Family Status  Relation Status Death Age  . Mother Deceased 63  . Father Deceased 105  . Daughter Alive   . Daughter Alive         Objective:   Physical Exam Filed Vitals:   01/14/12 1431  Height: 5\' 2"  (1.575 m)  Weight: 156 lb 6.4 oz (70.943 kg)   Alert and oriented. Skin warm and dry. Oral mucosa is moist.   . Sclera anicteric, conjunctivae is pink. Thyroid not enlarged. No cervical lymphadenopathy. Lungs clear. Heart regular rate and rhythm.  Abdomen is soft. Bowel sounds are positive. No hepatomegaly. No abdominal masses felt. No tenderness.  No edema to lower extremities.       Assessment:   GERD.  Occasionally has nocturnal acid reflux. Abnormal laryngoscopy.  PUD disease needs to be ruled out. Lung nodule with surveillance in 3 months. Due for a screening colonoscopy.     Plan:     EGD/Colonoscopy with Dr. Karilyn Cota. The risks and benefits such as perforation, bleeding, and infection were reviewed with the patient and is agreeable.

## 2012-01-29 ENCOUNTER — Encounter (HOSPITAL_COMMUNITY): Payer: Self-pay | Admitting: Pharmacy Technician

## 2012-02-06 ENCOUNTER — Encounter: Payer: Self-pay | Admitting: Pulmonary Disease

## 2012-02-07 ENCOUNTER — Ambulatory Visit (INDEPENDENT_AMBULATORY_CARE_PROVIDER_SITE_OTHER): Payer: Medicare Other | Admitting: Pulmonary Disease

## 2012-02-07 ENCOUNTER — Encounter: Payer: Self-pay | Admitting: Pulmonary Disease

## 2012-02-07 VITALS — BP 116/60 | HR 71 | Temp 97.9°F | Ht 61.5 in | Wt 156.2 lb

## 2012-02-07 DIAGNOSIS — R911 Solitary pulmonary nodule: Secondary | ICD-10-CM

## 2012-02-07 DIAGNOSIS — I2510 Atherosclerotic heart disease of native coronary artery without angina pectoris: Secondary | ICD-10-CM

## 2012-02-07 DIAGNOSIS — Z87891 Personal history of nicotine dependence: Secondary | ICD-10-CM

## 2012-02-07 DIAGNOSIS — J984 Other disorders of lung: Secondary | ICD-10-CM

## 2012-02-07 NOTE — Progress Notes (Signed)
Chief Complaint  Patient presents with  . Advice Only    refer Dr. Ouida Sills for lung nodule on CT scan 01/03/12. Pt here for 2nd opinion.    History of Present Illness: [name] is a 77 y.o. female former smoker for evaluation of pulmonary nodule.  She started getting blood in her throat when she would cough.  This started over the summer after she was exposed to acrylic.  As a result she had a chest xray which showed a right upper lobe nodule.  This was latter confirmed by CT chest.  She was therefore referred to pulmonary.  She denies fever, nose bleeds, sinus congestion, vision change, cough, wheeze, chest pain, gland swelling, skin rashes, joint pain, abdominal pain or leg swelling.  She was seen by ENT (Dr. Suszanne Conners), and at time of larygoscopy was felt to have vocal cord irritation/edema likely related to reflux.  Her hoarseness has improved since being started on reflux therapy.  She is not bringing up as much blood.  There is no history of asthma, pneumonia, or exposure to tuberculosis.  She is retired, and worked as a Diplomatic Services operational officer.  She quit smoking in 1978, but used to smoke 2 packs per day.  She had CT neck from November 07, 2000.  The report mentions presence of 5 mm nodule in right lung apex.  Unfortunately, the images from this are not available for review.  She does not recall having any other chest imaging studies.  Past Medical History  Diagnosis Date  . Urinary incontinence   . Cystocele   . Fibroid   . GERD (gastroesophageal reflux disease)   . Lesion of right lung   . Hypercholesteremia   . Hypothyroidism   . Post menopausal syndrome   . Burning mouth syndrome   . Plantar fascial fibromatosis   . Osteoarthrosis     Past Surgical History  Procedure Date  . Cataract extraction   . Bladder suspension     Anterior repair  . Abdominal hysterectomy 1980  . Colonoscopy   . Upper gastrointestinal endoscopy     Current Outpatient Prescriptions on File Prior to Visit    Medication Sig Dispense Refill  . CELEBREX 200 MG capsule Take 200 mg by mouth as needed. For pain      . esomeprazole (NEXIUM) 40 MG capsule Take 1 capsule (40 mg total) by mouth daily.  30 capsule  11  . estradiol (VIVELLE-DOT) 0.05 MG/24HR Place 1 patch (0.05 mg total) onto the skin 2 (two) times a week.  26 patch  3  . ESTRADIOL VA Place 1 applicator vaginally. Twice a week      . FIBER PO Take 2 Units by mouth daily. 2 units=2 teaspoonfuls      . levothyroxine (SYNTHROID, LEVOTHROID) 75 MCG tablet Take 75 mcg by mouth daily.        [name] loratadine (CLARITIN) 10 MG tablet Take 10 mg by mouth daily.      . Misc Natural Products (TART CHERRY ADVANCED) CAPS Take by mouth. Patient takes this Black Cherry 250 mg Capsule twice daily       . mometasone (NASONEX) 50 MCG/ACT nasal spray Place 2 sprays into the nose as needed. For allergies      . Multiple Vitamin (MULTIVITAMIN) capsule Take 1 capsule by mouth daily.        . Probiotic Product (TRUBIOTICS PO) Take 1 capsule by mouth daily.         Allergies  Allergen Reactions  .  Codeine Nausea And Vomiting    Family History  Problem Relation Age of Onset  . Uterine cancer Mother   . Heart disease Mother   . Osteoporosis Mother   . Pancreatic cancer Father     pancreatic  . Pancreatic cancer Father   . Multiple sclerosis Daughter   . Cancer Maternal Aunt     Oral cancer  . Other Mother     abdominal aortic aneurysm  . Other Mother     chronic lymphoid leukemia  . Polycythemia Father     History  Substance Use Topics  . Smoking status: Former Smoker -- 2.0 packs/day for 25 years    Types: Cigarettes    Quit date: 06/03/1976  . Smokeless tobacco: Never Used  . Alcohol Use: Yes     occasional wine    Review of Systems  Constitutional: Negative for fever, appetite change and unexpected weight change.  HENT: Negative for ear pain, congestion, sore throat, sneezing, trouble swallowing and dental problem.   Respiratory: Positive  for cough.   Cardiovascular: Negative for chest pain, palpitations and leg swelling.  Gastrointestinal: Negative for abdominal pain.  Musculoskeletal: Negative for joint swelling.  Skin: Negative for rash.  Neurological: Negative for headaches.  Psychiatric/Behavioral: Negative for dysphoric mood. The patient is not nervous/anxious.    Physical Exam: Filed Vitals:   02/07/12 1419 02/07/12 1420  BP:  116/60  Pulse:  71  Temp: 97.9 F (36.6 C)   TempSrc: Oral   Height: 5' 1.5" (1.562 m)   Weight: 156 lb 3.2 oz (70.852 kg)   SpO2:  96%  ,  Current Encounter SPO2  02/07/12 1420 96%    Wt Readings from Last 3 Encounters:  02/07/12 156 lb 3.2 oz (70.852 kg)  01/14/12 156 lb 6.4 oz (70.943 kg)  01/09/12 156 lb (70.761 kg)    Body mass index is 29.04 kg/(m^2).   General - No distress ENT - TM clear, no sinus tenderness, no oral exudate, no LAN, no thyromegaly Cardiac - s1s2 regular, no murmur, pulses symmetric, no edema Chest - normal respiratory excursion, good air entry, no wheeze/rales/dullness Back - no focal tenderness Abd - soft, non-tender, no organomegaly, + bowel sounds Ext - normal motor strength Neuro - Cranial nerves are normal. PERLA. EOM's intact. Skin - no discernible active dermatitis, erythema, urticaria or inflammatory process. Psych - normal mood, and behavior.   Ct Chest Wo Contrast  01/03/2012  *RADIOLOGY REPORT*  Clinical Data: Hemoptysis.  Prior history of smoking.  Possible nodule noted on recent chest x-ray.  CT CHEST WITHOUT CONTRAST  Technique:  Multidetector CT imaging of the chest was performed following the standard protocol without IV contrast.  Comparison: Chest x-ray 12/28/2011.   Findings:   Mediastinum: Heart size is normal. There is no significant pericardial fluid, thickening or pericardial calcification. There is atherosclerosis of the thoracic aorta, the great vessels of the mediastinum and the coronary arteries, including calcified  atherosclerotic plaque in the left anterior descending coronary artery. Faint calcifications of the aortic valve. No pathologically enlarged mediastinal or hilar lymph nodes. Please note that accurate exclusion of hilar adenopathy is limited on noncontrast CT scans.   Esophagus is unremarkable in appearance.   Lungs/Pleura: In the periphery of the right apex there is a pleural- based subsolid nodule that measures 14 x 9 mm (central solid component measures 10 mm in greatest length) as measured on images 16 of series 3 and 2.  This lesion has slightly spiculated margins and  some mild overlying pleural retraction.  No acute consolidative airspace disease.  No pleural effusions.  Upper Abdomen: Unremarkable.  Musculoskeletal: There are no aggressive appearing lytic or blastic lesions noted in the visualized portions of the skeleton. Levoscoliosis of the thoracic spine centered at the T5. IMPRESSION:  1.  Subsolid nodule in the apex of the right upper lobe measuring 14 x 9 mm (central solid component 10 mm in greatest length).  This is suspicious for, but not definitive for, a small adenocarcinoma; although an area of scarring could have a similar appearance.  At this time, a follow-up CT scan of the thorax in 3 months is recommended to confirm persistence or document resolution. This recommendation follows the consensus statement: Recommendations for the Management of Subsolid Pulmonary Nodules Detected at CT:  A Statement from the Fleischner Society as published in Radiology 2013; 266:304-317.  2. Atherosclerosis, including left anterior descending coronary artery disease.  3. Levoscoliosis of the thoracic spine.  Original Report Authenticated By: Florencia Reasons, M.D.    Assessment/Plan:  Coralyn Helling, MD [facility] Pager:  [phone] 02/07/2012, 2:28 PM

## 2012-02-07 NOTE — Assessment & Plan Note (Signed)
This was incidentally seen on CT chest.  She does not have symptoms to suggest angina pectoris.  Advised her to d/w her PCP.

## 2012-02-07 NOTE — Patient Instructions (Signed)
Will schedule PET scan and breathing test (PFT)>>will call with results Follow up in 2 weeks

## 2012-02-07 NOTE — Progress Notes (Deleted)
  Subjective:    Patient ID: [name], female    DOB: 09-29-1934, 77 y.o.   MRN: [id number]  HPI    Review of Systems  Constitutional: Negative for fever, appetite change and unexpected weight change.  HENT: Negative for ear pain, congestion, sore throat, sneezing, trouble swallowing and dental problem.   Respiratory: Positive for cough.   Cardiovascular: Negative for chest pain, palpitations and leg swelling.  Gastrointestinal: Negative for abdominal pain.  Musculoskeletal: Negative for joint swelling.  Skin: Negative for rash.  Neurological: Negative for headaches.  Psychiatric/Behavioral: Negative for dysphoric mood. The patient is not nervous/anxious.        Objective:   Physical Exam        Assessment & Plan:

## 2012-02-07 NOTE — Assessment & Plan Note (Signed)
She has history of smoking.  She has a right upper lobe nodule 14 x 9 mm.  There is mention of right lung nodule from CT in 2002, but this was only 5 mm.  I am not sure if this is the same lesion since images from 2002 are not available.  Reviewed several different options on how to proceed: 1) PET scan, 2) Biopsy attempt with electro-navigational bronchoscopy, 3) Thoracic surgery evaluation, 4) follow up CT chest in November 2013.  Will proceed with PET scan.  I explained that the size of lesion may be lower than threshold sensitivity.  Will also arrange for pulmonary function tests to assess lung function.  Depending on results will decide if radiographic follow up is needed, or if she needs to have more definitive interventions.

## 2012-02-08 ENCOUNTER — Ambulatory Visit (INDEPENDENT_AMBULATORY_CARE_PROVIDER_SITE_OTHER): Payer: Medicare Other | Admitting: Pulmonary Disease

## 2012-02-08 DIAGNOSIS — R911 Solitary pulmonary nodule: Secondary | ICD-10-CM

## 2012-02-08 DIAGNOSIS — J984 Other disorders of lung: Secondary | ICD-10-CM

## 2012-02-08 NOTE — Progress Notes (Signed)
PFT done today. 

## 2012-02-12 ENCOUNTER — Encounter: Payer: Self-pay | Admitting: Pulmonary Disease

## 2012-02-12 ENCOUNTER — Telehealth: Payer: Self-pay | Admitting: Pulmonary Disease

## 2012-02-12 DIAGNOSIS — J449 Chronic obstructive pulmonary disease, unspecified: Secondary | ICD-10-CM

## 2012-02-12 HISTORY — DX: Chronic obstructive pulmonary disease, unspecified: J44.9

## 2012-02-12 NOTE — Telephone Encounter (Signed)
PFT 02/08/12>>FEV1 1.63 (95%), FEV1% 65, TLC 4.12 (94%), DLCO 74%, no BD  Results d/w pt over phone.  Explained that she has very mild changes of COPD.  She has minimal respiratory symptoms at present.  Will continue to monitor clinically.   She has PET scan scheduled for 9/20.  Will call her with results.

## 2012-02-14 ENCOUNTER — Encounter (HOSPITAL_COMMUNITY): Payer: Self-pay | Admitting: *Deleted

## 2012-02-14 ENCOUNTER — Encounter (HOSPITAL_COMMUNITY)
Admission: RE | Disposition: A | Discharge status: Home / Self Care | Payer: Self-pay | Source: Ambulatory Visit | Attending: Internal Medicine

## 2012-02-14 ENCOUNTER — Ambulatory Visit (HOSPITAL_COMMUNITY)
Admission: RE | Admit: 2012-02-14 | Discharge: 2012-02-14 | Disposition: A | Discharge status: Home / Self Care | Payer: Medicare Other | Source: Ambulatory Visit | Attending: Internal Medicine | Admitting: Internal Medicine

## 2012-02-14 DIAGNOSIS — D126 Benign neoplasm of colon, unspecified: Secondary | ICD-10-CM

## 2012-02-14 DIAGNOSIS — D131 Benign neoplasm of stomach: Secondary | ICD-10-CM | POA: Insufficient documentation

## 2012-02-14 DIAGNOSIS — R093 Abnormal sputum: Secondary | ICD-10-CM

## 2012-02-14 DIAGNOSIS — K219 Gastro-esophageal reflux disease without esophagitis: Secondary | ICD-10-CM | POA: Insufficient documentation

## 2012-02-14 DIAGNOSIS — K621 Rectal polyp: Secondary | ICD-10-CM | POA: Insufficient documentation

## 2012-02-14 DIAGNOSIS — Z1211 Encounter for screening for malignant neoplasm of colon: Secondary | ICD-10-CM

## 2012-02-14 DIAGNOSIS — J4489 Other specified chronic obstructive pulmonary disease: Secondary | ICD-10-CM | POA: Insufficient documentation

## 2012-02-14 DIAGNOSIS — K620 Anal polyp: Secondary | ICD-10-CM | POA: Insufficient documentation

## 2012-02-14 DIAGNOSIS — E7800 Pure hypercholesterolemia, unspecified: Secondary | ICD-10-CM | POA: Insufficient documentation

## 2012-02-14 DIAGNOSIS — J449 Chronic obstructive pulmonary disease, unspecified: Secondary | ICD-10-CM | POA: Insufficient documentation

## 2012-02-14 DIAGNOSIS — K228 Other specified diseases of esophagus: Secondary | ICD-10-CM

## 2012-02-14 SURGERY — COLONOSCOPY WITH ESOPHAGOGASTRODUODENOSCOPY (EGD)
Anesthesia: Moderate Sedation

## 2012-02-14 MED ORDER — MIDAZOLAM HCL 5 MG/5ML IJ SOLN
INTRAMUSCULAR | Status: AC
  Filled 2012-02-14: qty 10

## 2012-02-14 MED ORDER — BUTAMBEN-TETRACAINE-BENZOCAINE 2-2-14 % EX AERO
INHALATION_SPRAY | CUTANEOUS | Status: DC | PRN
  Administered 2012-02-14: 2 via TOPICAL

## 2012-02-14 MED ORDER — MEPERIDINE HCL 50 MG/ML IJ SOLN
INTRAMUSCULAR | Status: DC | PRN
  Administered 2012-02-14 (×2): 25 mg via INTRAVENOUS

## 2012-02-14 MED ORDER — STERILE WATER FOR IRRIGATION IR SOLN
Status: DC | PRN
  Administered 2012-02-14: 12:00:00

## 2012-02-14 MED ORDER — SODIUM CHLORIDE 0.45 % IV SOLN
INTRAVENOUS | Status: DC
  Administered 2012-02-14: 12:00:00 via INTRAVENOUS

## 2012-02-14 MED ORDER — MEPERIDINE HCL 50 MG/ML IJ SOLN
INTRAMUSCULAR | Status: AC
  Filled 2012-02-14: qty 1

## 2012-02-14 MED ORDER — MIDAZOLAM HCL 5 MG/5ML IJ SOLN
INTRAMUSCULAR | Status: DC | PRN
  Administered 2012-02-14: 2 mg via INTRAVENOUS
  Administered 2012-02-14 (×2): 1 mg via INTRAVENOUS
  Administered 2012-02-14: 2 mg via INTRAVENOUS

## 2012-02-14 NOTE — Op Note (Signed)
EGD PROCEDURE REPORT  PATIENT:  [name]  MR#:  [id number] Birthdate:  1934/10/20, 77 y.o., female Endoscopist:  Dr. Malissa Hippo, MD Referred By:  Dr. Carylon Perches, MD Procedure Date: 02/14/2012  Procedure:   EGD & Colonoscopy.  Indications:  Patient is 77 year old Caucasian female with chronic GERD and complains of back taste in her throat and also is noted blood tinged throat secretions. She has been evaluated by ENT specialist in no source found. He also has undergone evaluation by pulmonologist and found to have right upper lobe lung nodule and PET scan is pending. She has not seen any blood in the last 2 weeks. She is  undergoing diagnostic EGD and screening colonoscopy.           Informed Consent:  The risks, benefits, alternatives & imponderables which include, but are not limited to, bleeding, infection, perforation, drug reaction and potential missed lesion have been reviewed.  The potential for biopsy, lesion removal, esophageal dilation, etc. have also been discussed.  Questions have been answered.  All parties agreeable.  Please see history & physical in medical record for more information.  Medications:  Demerol 50 mg IV Versed 6 mg IV Cetacaine spray topically for oropharyngeal anesthesia  EGD  Description of procedure:  The endoscope was introduced through the mouth and advanced to the second portion of the duodenum without difficulty or limitations. The mucosal surfaces were surveyed very carefully during advancement of the scope and upon withdrawal.  Findings:  Esophagus:  Three  small islands of ectopic gastric gastric mucosa noted just below UES. No esophageal erosions identified. Wavy GE junction. Biopsy on prior occasion has been negative for [name]'s esophagus. GEJ:  40 cm Stomach:  Stomach was empty and distended very well with insufflation. Folds in the proximal stomach are normal. Multiple small hyperplastic-appearing polyps noted at gastric body. Antral  mucosa was unremarkable. Angularis fundus and cardia were examined by retroflexing the scope and were unremarkable other than hyperplastic-appearing polyps. Duodenum:  Normal bulbar and post bulbar mucosa.  Therapeutic/Diagnostic Maneuvers Performed:  None  COLONOSCOPY Description of procedure:  After a digital rectal exam was performed, that colonoscope was advanced from the anus through the rectum and colon to the area of the cecum, ileocecal valve and appendiceal orifice. The cecum was deeply intubated. These structures were well-seen and photographed for the record. From the level of the cecum and ileocecal valve, the scope was slowly and cautiously withdrawn. The mucosal surfaces were carefully surveyed utilizing scope tip to flexion to facilitate fold flattening as needed. The scope was pulled down into the rectum where a thorough exam including retroflexion was performed.  Findings:   Prep excellent. 2 small polyps ablated via cold biopsy and submitted together(ascending colon and hepatic flexure). 6 mm polyp snared from proximal transverse colon. 3 mm polyp at rectum was coagulated using snare tip. Focal thickening to anoderm and small hemorrhoids below the dentate line.  Therapeutic/Diagnostic Maneuvers Performed:  See above  Complications:  None  Cecal Withdrawal Time:  16 minutes  Impression:  No lesion found to account for patient's blood-tinged throat secretions. No mucosal abnormality to hypopharyngeal mucosa. No evidence of erosive esophagitis or peptic ulcer disease. 3 small islands of ectopic gastric mucosa at proximal esophagus below UES. Multiple  small hyperplastic polyps at gastric body and fundus. Colonoscopy completed to cecum. Two small polyps ablated via cold biopsy and submitted together. These are located at ascending colon and hepatic flexure. 6 mm polyp snared from proximal  transverse colon. Small rectal polyps coagulated using snare tip.  Recommendations:   Standard instructions given. I will contact patient with biopsy results and further recommendations.  REHMAN,NAJEEB U  02/14/2012 1:07 PM  CC: Dr. Carylon Perches, MD & Dr. Bonnetta Barry ref. provider found

## 2012-02-14 NOTE — H&P (Signed)
[name] is an 77 y.o. female.   Chief Complaint: Patient is here for EGD and colonoscopy. HPI: Patient is 77 year old Caucasian female with chronic GERD and maintened on  PPI whose last EGD was  5 years ago who presents with at this the throat and fluid or throat dimension [name] to be blood tinged. Few times it was flat. She's been evaluated by ENT specialist and felt to have laryngitis due to reflux. Nexium was doubled. She also chest x-ray shows lung nodules and she is scheduled to undergo PET scan. She hasn't seen any blood in the last 2 weeks. She denies epistaxis. She says post nasal drainage of this has been clear. Because of these symptoms she was advised to undergo evaluation of upper GI tract. She denies dysphagia abdominal pain melena or rectal bleeding. She is also undergoing screening colonoscopy. Family history is negative for colorectal carcinoma  Past Medical History  Diagnosis Date  . Urinary incontinence   . Cystocele   . Fibroid   . GERD (gastroesophageal reflux disease)   . Lesion of right lung   . Hypercholesteremia   . Hypothyroidism   . Post menopausal syndrome   . Burning mouth syndrome   . Plantar fascial fibromatosis   . COPD (chronic obstructive pulmonary disease) 02/12/2012    PFT 02/08/12>>FEV1 1.63 (95%), FEV1% 65, TLC 4.12 (94%), DLCO 74%, no BD     Past Surgical History  Procedure Date  . Cataract extraction   . Bladder suspension     Anterior repair  . Abdominal hysterectomy 1980  . Colonoscopy   . Upper gastrointestinal endoscopy     Family History  Problem Relation Age of Onset  . Uterine cancer Mother   . Heart disease Mother   . Osteoporosis Mother   . Pancreatic cancer Father     pancreatic  . Pancreatic cancer Father   . Multiple sclerosis Daughter   . Cancer Maternal Aunt     Oral cancer  . Other Mother     abdominal aortic aneurysm  . Other Mother     chronic lymphoid leukemia  . Polycythemia Father    Social History:   reports that she quit smoking about 35 years ago. Her smoking use included Cigarettes. She has a 50 pack-year smoking history. She has never used smokeless tobacco. She reports that she drinks alcohol. She reports that she does not use illicit drugs.  Allergies:  Allergies  Allergen Reactions  . Codeine Nausea And Vomiting    Medications Prior to Admission  Medication Sig Dispense Refill  . CELEBREX 200 MG capsule Take 200 mg by mouth as needed. For pain      . esomeprazole (NEXIUM) 40 MG capsule Take 1 capsule (40 mg total) by mouth daily.  30 capsule  11  . estradiol (VIVELLE-DOT) 0.05 MG/24HR Place 1 patch (0.05 mg total) onto the skin 2 (two) times a week.  26 patch  3  . ESTRADIOL VA Place 1 applicator vaginally. Twice a week      . FIBER PO Take 2 Units by mouth daily. 2 units=2 teaspoonfuls      . levothyroxine (SYNTHROID, LEVOTHROID) 75 MCG tablet Take 75 mcg by mouth daily.        [name] loratadine (CLARITIN) 10 MG tablet Take 10 mg by mouth daily.      . Misc Natural Products (TART CHERRY ADVANCED) CAPS Take by mouth. Patient takes this Black Cherry 250 mg Capsule twice daily       .  mometasone (NASONEX) 50 MCG/ACT nasal spray Place 2 sprays into the nose as needed. For allergies      . Multiple Vitamin (MULTIVITAMIN) capsule Take 1 capsule by mouth daily.        . Probiotic Product (TRUBIOTICS PO) Take 1 capsule by mouth daily.         No results found for this or any previous visit (from the past 48 hour(s)). No results found.  ROS  Blood pressure 115/66, pulse 69, temperature 97.5 F (36.4 C), temperature source Oral, resp. rate 16, SpO2 94.00%. Physical Exam  Constitutional: She appears well-developed and well-nourished.  HENT:  Mouth/Throat: Oropharynx is clear and moist.  Eyes: Conjunctivae normal are normal. No scleral icterus.  Neck: No thyromegaly present.  Cardiovascular: Normal rate, regular rhythm and normal heart sounds.   No murmur heard. Respiratory: Effort  normal and breath sounds normal.  GI: Soft. She exhibits no distension and no mass. There is no tenderness.  Musculoskeletal: She exhibits no edema.  Lymphadenopathy:    She has no cervical adenopathy.  Neurological: She is alert.  Skin: Skin is warm and dry.     Assessment/Plan Chronic GERD. Blood tinged throat secretions. Diagnostic EGD and screening colonoscopy.  [name] U 02/14/2012, 12:19 PM

## 2012-02-15 ENCOUNTER — Telehealth: Payer: Self-pay | Admitting: Pulmonary Disease

## 2012-02-15 ENCOUNTER — Encounter (HOSPITAL_COMMUNITY)
Admission: RE | Admit: 2012-02-15 | Discharge: 2012-02-15 | Disposition: A | Discharge status: Home / Self Care | Payer: Medicare Other | Source: Ambulatory Visit | Attending: Pulmonary Disease | Admitting: Pulmonary Disease

## 2012-02-15 DIAGNOSIS — R911 Solitary pulmonary nodule: Secondary | ICD-10-CM

## 2012-02-15 DIAGNOSIS — I708 Atherosclerosis of other arteries: Secondary | ICD-10-CM | POA: Insufficient documentation

## 2012-02-15 DIAGNOSIS — D381 Neoplasm of uncertain behavior of trachea, bronchus and lung: Secondary | ICD-10-CM | POA: Insufficient documentation

## 2012-02-15 DIAGNOSIS — Z87891 Personal history of nicotine dependence: Secondary | ICD-10-CM | POA: Insufficient documentation

## 2012-02-15 DIAGNOSIS — K429 Umbilical hernia without obstruction or gangrene: Secondary | ICD-10-CM | POA: Insufficient documentation

## 2012-02-15 DIAGNOSIS — M47817 Spondylosis without myelopathy or radiculopathy, lumbosacral region: Secondary | ICD-10-CM | POA: Insufficient documentation

## 2012-02-15 DIAGNOSIS — J984 Other disorders of lung: Secondary | ICD-10-CM | POA: Insufficient documentation

## 2012-02-15 DIAGNOSIS — I700 Atherosclerosis of aorta: Secondary | ICD-10-CM | POA: Insufficient documentation

## 2012-02-15 DIAGNOSIS — I2510 Atherosclerotic heart disease of native coronary artery without angina pectoris: Secondary | ICD-10-CM | POA: Insufficient documentation

## 2012-02-15 DIAGNOSIS — R222 Localized swelling, mass and lump, trunk: Secondary | ICD-10-CM | POA: Insufficient documentation

## 2012-02-15 MED ORDER — FLUDEOXYGLUCOSE F - 18 (FDG) INJECTION: 18.6000 | Freq: Once | INTRAVENOUS | Status: AC | PRN

## 2012-02-15 NOTE — Telephone Encounter (Signed)
Nm Pet Image Initial (pi) Skull Base To Thigh  02/15/2012  *RADIOLOGY REPORT*  Clinical Data: Initial treatment strategy for right pulmonary nodule.  NUCLEAR MEDICINE PET SKULL BASE TO THIGH  Fasting Blood Glucose:  111  Technique:  18.6 mCi F-18 FDG was injected intravenously. CT data was obtained and used for attenuation correction and anatomic localization only.  (This was not acquired as a diagnostic CT examination.) Additional exam technical data entered on technologist worksheet.  Comparison:  01/03/2012  Findings:  Neck: No hypermetabolic lymph nodes in the neck.  Chest:  Spiculated 1.3 cm peripheral right apical lung nodule maximum standard uptake value is 3.4.  No hypermetabolic adenopathy in the chest observed.  Muscular activity noted within or adjacent to the right subscapularis muscle, highly likely to be physiologic and incidental.  Coronary artery atherosclerosis noted.  Abdomen/Pelvis:  No abnormal hypermetabolic activity within the liver, pancreas, adrenal glands, or spleen.  No hypermetabolic lymph nodes in the abdomen or pelvis.  Mild aortoiliac atherosclerotic vascular disease noted.  Small umbilical hernia contains adipose tissue.  Lumbar spondylosis noted.  Skeleton:  No focal hypermetabolic activity to suggest skeletal metastasis.  IMPRESSION:  1.  The spiculated 1.3 cm right apical nodule has a maximum standard uptake value of 3.4, compatible with malignancy or less likely active granulomatous process.  No hypermetabolic adenopathy noted. 2.  Coronary artery atherosclerosis.   Original Report Authenticated By: Dellia Cloud, M.D.     Attempted to call pt to discuss PET results.  She will likely need evaluation by thoracic surgery.  Will attempt to call again on 02/16/12.

## 2012-02-15 NOTE — Telephone Encounter (Signed)
Pt returned call.  I have discussed results of PET scan.  Explained that she has significant uptake in Rt upper lung nodule.    I have placed order to arrange for referral to cardio-thoracic surgeon to assess for resection of the nodule.

## 2012-02-18 ENCOUNTER — Telehealth: Payer: Self-pay | Admitting: Pulmonary Disease

## 2012-02-18 NOTE — Telephone Encounter (Signed)
Spoke with patient and she is aware that appointment has been scheduled with Dr. Laneta Simmers for Thurs. 02/21/12 at 4:00. Pt will need to arrive at 3:45. [address], [place], [place] phone number [phone]. Pt is aware of appointment date, time and location. Rhonda J Cobb

## 2012-02-20 ENCOUNTER — Encounter: Payer: Self-pay | Admitting: Pulmonary Disease

## 2012-02-20 ENCOUNTER — Ambulatory Visit (INDEPENDENT_AMBULATORY_CARE_PROVIDER_SITE_OTHER): Payer: Medicare Other | Admitting: Pulmonary Disease

## 2012-02-20 VITALS — BP 110/70 | HR 68 | Temp 97.7°F | Ht 61.5 in | Wt 157.2 lb

## 2012-02-20 DIAGNOSIS — K219 Gastro-esophageal reflux disease without esophagitis: Secondary | ICD-10-CM

## 2012-02-20 DIAGNOSIS — R911 Solitary pulmonary nodule: Secondary | ICD-10-CM

## 2012-02-20 DIAGNOSIS — J449 Chronic obstructive pulmonary disease, unspecified: Secondary | ICD-10-CM

## 2012-02-20 NOTE — Assessment & Plan Note (Signed)
She has very mild changes on her PFT.  I don't think she needs inhaler therapy at this time.

## 2012-02-20 NOTE — Assessment & Plan Note (Signed)
She has hx of smoking, and PET positive RUL nodule.  She is schedule for evaluation with Dr. Laneta Simmers later this week.

## 2012-02-20 NOTE — Progress Notes (Signed)
Chief Complaint  Patient presents with  . Follow-up    had pet scan 02/15/12. has an appt with Dr. Laneta Simmers tomorrow.     History of Present Illness: [name] is a 77 y.o. female former smoker with mild COPD and pulmonary nodule.  She also has chronic cough from reflux.  She is here to review PFT and PET scan results.  She still has cough, but this is improving with anti-reflux therapy.  Tests: CT chest 01/03/12>>14x9 mm RUL nodule PFT 02/08/12>>FEV1 1.63 (95%), FEV1% 65, TLC 4.12 (94%), DLCO 74%, no BD PET scan 02/07/12>>13 mm RUL nodule with 3.4 SUV  Past Medical History  Diagnosis Date  . Urinary incontinence   . Cystocele   . Fibroid   . GERD (gastroesophageal reflux disease)   . Lesion of right lung   . Hypercholesteremia   . Hypothyroidism   . Post menopausal syndrome   . Burning mouth syndrome   . Plantar fascial fibromatosis   . COPD (chronic obstructive pulmonary disease) 02/12/2012    PFT 02/08/12>>FEV1 1.63 (95%), FEV1% 65, TLC 4.12 (94%), DLCO 74%, no BD     Past Surgical History  Procedure Date  . Cataract extraction   . Bladder suspension     Anterior repair  . Abdominal hysterectomy 1980  . Colonoscopy   . Upper gastrointestinal endoscopy     Outpatient Encounter Prescriptions as of 02/20/2012  Medication Sig Dispense Refill  . CELEBREX 200 MG capsule Take 200 mg by mouth as needed. For pain      . esomeprazole (NEXIUM) 40 MG capsule Take 1 capsule (40 mg total) by mouth daily.  30 capsule  11  . estradiol (VIVELLE-DOT) 0.05 MG/24HR Place 1 patch (0.05 mg total) onto the skin 2 (two) times a week.  26 patch  3  . ESTRADIOL VA Place 1 applicator vaginally. Twice a week      . FIBER PO Take 2 Units by mouth daily. 2 units=2 teaspoonfuls      . levothyroxine (SYNTHROID, LEVOTHROID) 75 MCG tablet Take 75 mcg by mouth daily.        [name] loratadine (CLARITIN) 10 MG tablet Take 10 mg by mouth daily.      . Misc Natural Products (TART CHERRY ADVANCED) CAPS  Take by mouth. Patient takes this Black Cherry 250 mg Capsule twice daily       . mometasone (NASONEX) 50 MCG/ACT nasal spray Place 2 sprays into the nose as needed. For allergies      . Multiple Vitamin (MULTIVITAMIN) capsule Take 1 capsule by mouth daily.        . Probiotic Product (TRUBIOTICS PO) Take 1 capsule by mouth daily.         Allergies  Allergen Reactions  . Codeine Nausea And Vomiting    Physical Exam:  Filed Vitals:   02/20/12 1334 02/20/12 1335  BP:  110/70  Pulse:  68  Temp: 97.7 F (36.5 C)   TempSrc: Oral   Height: 5' 1.5" (1.562 m)   Weight: 157 lb 3.2 oz (71.305 kg)   SpO2:  97%    Body mass index is 29.22 kg/(m^2). Wt Readings from Last 2 Encounters:  02/20/12 157 lb 3.2 oz (71.305 kg)  02/07/12 156 lb 3.2 oz (70.852 kg)    General - No distress ENT - TM clear, no sinus tenderness, no oral exudate, no LAN, no thyromegaly Cardiac - s1s2 regular, no murmur, pulses symmetric, no edema Chest - normal respiratory excursion, good air  entry, no wheeze/rales/dullness Back - no focal tenderness Abd - soft, non-tender, no organomegaly, + bowel sounds Ext - normal motor strength Neuro - Cranial nerves are normal. PERLA. EOM's intact. Skin - no discernible active dermatitis, erythema, urticaria or inflammatory process. Psych - normal mood, and behavior.   Assessment/Plan:  Coralyn Helling, MD [facility] Pager:  [phone] 02/20/2012, 4:50 PM

## 2012-02-20 NOTE — Patient Instructions (Signed)
Follow up in 2 months

## 2012-02-20 NOTE — Assessment & Plan Note (Signed)
She has cough related to upper airway irritation from reflux.  She has some improvement with more aggressive anti-reflux therapy.

## 2012-02-21 ENCOUNTER — Encounter: Payer: Self-pay | Admitting: Radiation Oncology

## 2012-02-21 ENCOUNTER — Encounter: Payer: Self-pay | Admitting: Surgery

## 2012-02-21 ENCOUNTER — Ambulatory Visit
Admission: RE | Admit: 2012-02-21 | Discharge: 2012-02-21 | Disposition: A | Discharge status: Home / Self Care | Payer: Medicare Other | Source: Ambulatory Visit | Attending: Radiation Oncology | Admitting: Radiation Oncology

## 2012-02-21 ENCOUNTER — Institutional Professional Consult (permissible substitution) (INDEPENDENT_AMBULATORY_CARE_PROVIDER_SITE_OTHER): Payer: Medicare Other | Admitting: Surgery

## 2012-02-21 VITALS — BP 119/81 | HR 78 | Temp 97.9°F | Resp 18 | Ht 61.5 in | Wt 156.0 lb

## 2012-02-21 DIAGNOSIS — R911 Solitary pulmonary nodule: Secondary | ICD-10-CM

## 2012-02-21 NOTE — Progress Notes (Signed)
[address]            [place] [address]          [phone]      PCP is Carylon Perches, MD Referring Provider is Coralyn Helling, MD  Chief Complaint  Patient presents with  . Lung Lesion    MTOC/ RUL nodule, PET Scan 02/15/12, Chest CT 01/03/12    HPI:  The patient is a 77 year old former heavy smoker who reports noting some blood-streaked secretions in the back of her throat after coughing. This started over the summer. She had a chest x-ray that showed a right upper lobe lung nodule. CT scan of the chest confirmed a 14 x 9 mm nodule in the apex of the right upper lobe with some spiculation suspicious for bronchogenic carcinoma. There is no mediastinal or hilar adenopathy. She did have some calcification of the thoracic aorta as well as the great vessels in  the mediastinum and the coronary arteries, particularly the LAD. A PET scan showed this lesion to be hypermetabolic within maximum SUV of 3.4. No other hypermetabolic activity was seen. She does have a history of GI reflux and was seen by ENT and underwent flexible laryngoscopy showing edema of the vocal cords. It was felt that this was likely due to reflux. She has been on Nexium for years.  Past Medical History  Diagnosis Date  . Urinary incontinence   . Cystocele   . Fibroid   . GERD (gastroesophageal reflux disease)   . Lesion of right lung   . Hypercholesteremia   . Hypothyroidism   . Post menopausal syndrome   . Burning mouth syndrome   . Plantar fascial fibromatosis   . COPD (chronic obstructive pulmonary disease) 02/12/2012    PFT 02/08/12>>FEV1 1.63 (95%), FEV1% 65, TLC 4.12 (94%), DLCO 74%, no BD     Past Surgical History  Procedure Date  . Cataract extraction   . Bladder suspension     Anterior repair  . Abdominal hysterectomy 1980  . Colonoscopy   . Upper gastrointestinal endoscopy     Family History  Problem Relation Age of Onset  . Uterine cancer Mother   . Heart disease  Mother   . Osteoporosis Mother   . Pancreatic cancer Father     pancreatic  . Pancreatic cancer Father   . Multiple sclerosis Daughter   . Cancer Maternal Aunt     Oral cancer  . Other Mother     abdominal aortic aneurysm  . Other Mother     chronic lymphoid leukemia  . Polycythemia Father     Social History History  Substance Use Topics  . Smoking status: Former Smoker -- 2.0 packs/day for 25 years    Types: Cigarettes    Quit date: 06/03/1976  . Smokeless tobacco: Never Used  . Alcohol Use: Yes     occasional wine    Current Outpatient Prescriptions  Medication Sig Dispense Refill  . CELEBREX 200 MG capsule Take 200 mg by mouth as needed. For pain      . esomeprazole (NEXIUM) 40 MG capsule Take 1 capsule (40 mg total) by mouth daily.  30 capsule  11  . estradiol (VIVELLE-DOT) 0.05 MG/24HR Place 1 patch (0.05 mg total) onto the skin 2 (two) times a week.  26 patch  3  . ESTRADIOL VA Place 1 applicator vaginally. Twice a week      .  FIBER PO Take 2 Units by mouth daily. 2 units=2 teaspoonfuls      . levothyroxine (SYNTHROID, LEVOTHROID) 75 MCG tablet Take 75 mcg by mouth daily.        [name] loratadine (CLARITIN) 10 MG tablet Take 10 mg by mouth daily.      . Misc Natural Products (TART CHERRY ADVANCED) CAPS Take by mouth. Patient takes this Black Cherry 250 mg Capsule twice daily       . mometasone (NASONEX) 50 MCG/ACT nasal spray Place 2 sprays into the nose as needed. For allergies      . Multiple Vitamin (MULTIVITAMIN) capsule Take 1 capsule by mouth daily.        . Probiotic Product (TRUBIOTICS PO) Take 1 capsule by mouth daily.         Allergies  Allergen Reactions  . Codeine Nausea And Vomiting    Review of Systems  Constitutional: Negative for activity change, appetite change, fatigue and unexpected weight change.  HENT:       Feels like a lump in her throat after swallowing.  Eyes: Negative.   Respiratory: Positive for cough. Negative for shortness of breath.     Cardiovascular: Negative.   Gastrointestinal:       Reflux  Genitourinary: Negative.   Musculoskeletal: Negative.   Skin: Negative.   Neurological: Negative.   Hematological: Negative.   Psychiatric/Behavioral: Negative.     BP 119/81  Pulse 78  Temp 97.9 F (36.6 C) (Oral)  Resp 18  Ht 5' 1.5" (1.562 m)  Wt 156 lb (70.761 kg)  BMI 29.00 kg/m2  SpO2 95% Physical Exam  Constitutional: She is oriented to person, place, and time. She appears well-developed and well-nourished.  HENT:  Head: Normocephalic and atraumatic.  Mouth/Throat: Oropharynx is clear and moist.  Eyes: EOM are normal. Pupils are equal, round, and reactive to light.  Neck: Normal range of motion. Neck supple. No JVD present. No tracheal deviation present. No thyromegaly present.  Cardiovascular: Normal rate, regular rhythm, normal heart sounds and intact distal pulses.  Exam reveals no gallop and no friction rub.   No murmur heard. Pulmonary/Chest: Effort normal and breath sounds normal. No respiratory distress. She has no wheezes. She has no rales.  Abdominal: Soft. Bowel sounds are normal. She exhibits no distension and no mass. There is no tenderness.  Musculoskeletal: She exhibits no edema.  Lymphadenopathy:    She has no cervical adenopathy.  Neurological: She is alert and oriented to person, place, and time. She has normal strength. No cranial nerve deficit or sensory deficit.  Skin: Skin is warm and dry.  Psychiatric: She has a normal mood and affect.     Diagnostic Tests:  *RADIOLOGY REPORT*   Clinical Data: Initial treatment strategy for right pulmonary nodule.   NUCLEAR MEDICINE PET SKULL BASE TO THIGH   Fasting Blood Glucose:  111   Technique:  18.6 mCi F-18 FDG was injected intravenously. CT data was obtained and used for attenuation correction and anatomic localization only.  (This was not acquired as a diagnostic CT examination.) Additional exam technical data entered  on technologist worksheet.   Comparison:  01/03/2012   Findings:   Neck: No hypermetabolic lymph nodes in the neck.   Chest:  Spiculated 1.3 cm peripheral right apical lung nodule maximum standard uptake value is 3.4.  No hypermetabolic adenopathy in the chest observed.  Muscular activity noted within or adjacent to the right subscapularis muscle, highly likely to be physiologic and incidental.  Coronary  artery atherosclerosis noted.   Abdomen/Pelvis:  No abnormal hypermetabolic activity within the liver, pancreas, adrenal glands, or spleen.  No hypermetabolic lymph nodes in the abdomen or pelvis.  Mild aortoiliac atherosclerotic vascular disease noted.  Small umbilical hernia contains adipose tissue.  Lumbar spondylosis noted.   Skeleton:  No focal hypermetabolic activity to suggest skeletal metastasis.   IMPRESSION:   1.  The spiculated 1.3 cm right apical nodule has a maximum standard uptake value of 3.4, compatible with malignancy or less likely active granulomatous process.  No hypermetabolic adenopathy noted. 2.  Coronary artery atherosclerosis.     Original Report Authenticated By: Dellia Cloud, M.D.     *RADIOLOGY REPORT*   Clinical Data: Hemoptysis.  Prior history of smoking.  Possible nodule noted on recent chest x-ray.   CT CHEST WITHOUT CONTRAST 01/03/2012   Technique:  Multidetector CT imaging of the chest was performed following the standard protocol without IV contrast.   Comparison: Chest x-ray 12/28/2011.   Findings:   Mediastinum: Heart size is normal. There is no significant pericardial fluid, thickening or pericardial calcification. There is atherosclerosis of the thoracic aorta, the great vessels of the mediastinum and the coronary arteries, including calcified atherosclerotic plaque in the left anterior descending coronary artery. Faint calcifications of the aortic valve. No pathologically enlarged mediastinal or hilar lymph nodes.  Please note that accurate exclusion of hilar adenopathy is limited on noncontrast CT scans.  Esophagus is unremarkable in appearance.   Lungs/Pleura: In the periphery of the right apex there is a pleural- based subsolid nodule that measures 14 x 9 mm (central solid component measures 10 mm in greatest length) as measured on images 16 of series 3 and 2.  This lesion has slightly spiculated margins and some mild overlying pleural retraction.  No acute consolidative airspace disease.  No pleural effusions.   Upper Abdomen: Unremarkable.   Musculoskeletal: There are no aggressive appearing lytic or blastic lesions noted in the visualized portions of the skeleton. Levoscoliosis of the thoracic spine centered at the T5.   IMPRESSION: 1.  Subsolid nodule in the apex of the right upper lobe measuring 14 x 9 mm (central solid component 10 mm in greatest length).  This is suspicious for, but not definitive for, a small adenocarcinoma; although an area of scarring could have a similar appearance.  At this time, a follow-up CT scan of the thorax in 3 months is recommended to confirm persistence or document resolution. This recommendation follows the consensus statement: Recommendations for the Management of Subsolid Pulmonary Nodules Detected at CT:  A Statement from the Fleischner Society as published in Radiology 2013; 266:304-317. 2. Atherosclerosis, including left anterior descending coronary artery disease. 3. Levoscoliosis of the thoracic spine.   Original Report Authenticated By: Florencia Reasons, M.D.                                              Impression:  She has a 14 x 9 mm spiculated, nodular density in the peripheral aspect of the right upper lobe that has low-level hypermetabolic activity on PET scan and is very suspicious for a bronchogenic carcinoma, probably adenocarcinoma. I think the best treatment for this is going to be right thoracotomy with wedge  resection of the lesion followed by frozen section. If this is a carcinoma I will plan to proceed with right upper lobectomy and  mediastinal lymph node dissection. She said that she has had pulmonary function tests done recently and they were felt to be adequate to tolerate surgery. I will get a copy of these to be sure that she would tolerate a right upper lobectomy. I will also obtain an MRI of the brain to rule out intracranial metastasis. She has multiple cardiac risk factors and coronary calcification noted on CT scan. She does not admit to any cardiac symptoms but I think it would be best to have her see cardiology preoperatively for evaluation. After these things had been completed we will schedule her surgery as quickly as possible. I reviewed the CT and PET scan with her and her family and discussed the recommended plans as mentioned above. I discussed the operative procedure including alternatives, benefits, and risks including but not limited to bleeding, blood transfusion, infection, bronchial stump complications, prolonged air leak, respiratory failure, the possible need for adjuvant chemotherapy if she has any positive lymph nodes, and the risk of cancer recurrence. She understands all this and agrees to proceed.  Plan: 1. Review pulmonary function tests. 2. MRI of the brain. 3. Cardiology consultation with [facility] cardiology. 4. Schedule surgery after these had been completed, hopefully within 2 weeks.

## 2012-02-22 ENCOUNTER — Other Ambulatory Visit: Payer: Self-pay | Admitting: Surgery

## 2012-02-22 DIAGNOSIS — D381 Neoplasm of uncertain behavior of trachea, bronchus and lung: Secondary | ICD-10-CM

## 2012-02-22 DIAGNOSIS — C7931 Secondary malignant neoplasm of brain: Secondary | ICD-10-CM

## 2012-02-25 ENCOUNTER — Ambulatory Visit
Admission: RE | Admit: 2012-02-25 | Discharge: 2012-02-25 | Disposition: A | Discharge status: Home / Self Care | Payer: Medicare Other | Source: Ambulatory Visit | Attending: Surgery | Admitting: Surgery

## 2012-02-25 DIAGNOSIS — C7931 Secondary malignant neoplasm of brain: Secondary | ICD-10-CM

## 2012-02-25 DIAGNOSIS — D381 Neoplasm of uncertain behavior of trachea, bronchus and lung: Secondary | ICD-10-CM

## 2012-02-25 MED ORDER — GADOBENATE DIMEGLUMINE 529 MG/ML IV SOLN
14.0000 mL | Freq: Once | INTRAVENOUS | Status: AC | PRN
  Administered 2012-02-25: 14 mL via INTRAVENOUS

## 2012-02-27 ENCOUNTER — Encounter (INDEPENDENT_AMBULATORY_CARE_PROVIDER_SITE_OTHER): Payer: Self-pay | Admitting: *Deleted

## 2012-02-27 DIAGNOSIS — Z0279 Encounter for issue of other medical certificate: Secondary | ICD-10-CM

## 2012-02-29 ENCOUNTER — Other Ambulatory Visit: Payer: Medicare Other

## 2012-03-04 ENCOUNTER — Other Ambulatory Visit: Payer: Self-pay | Admitting: *Deleted

## 2012-03-04 ENCOUNTER — Encounter (HOSPITAL_COMMUNITY): Payer: Self-pay | Admitting: Pharmacy Technician

## 2012-03-04 DIAGNOSIS — R918 Other nonspecific abnormal finding of lung field: Secondary | ICD-10-CM

## 2012-03-11 ENCOUNTER — Ambulatory Visit (HOSPITAL_COMMUNITY)
Admission: RE | Admit: 2012-03-11 | Discharge: 2012-03-11 | Disposition: A | Discharge status: Home / Self Care | Payer: Medicare Other | Source: Ambulatory Visit | Attending: Surgery | Admitting: Surgery

## 2012-03-11 ENCOUNTER — Encounter (HOSPITAL_COMMUNITY): Payer: Self-pay

## 2012-03-11 ENCOUNTER — Encounter (HOSPITAL_COMMUNITY)
Admission: RE | Admit: 2012-03-11 | Discharge: 2012-03-11 | Disposition: A | Discharge status: Home / Self Care | Payer: Medicare Other | Source: Ambulatory Visit | Attending: Surgery | Admitting: Surgery

## 2012-03-11 ENCOUNTER — Ambulatory Visit (HOSPITAL_COMMUNITY): Admission: RE | Admit: 2012-03-11 | Payer: Medicare Other | Source: Ambulatory Visit

## 2012-03-11 VITALS — BP 146/78 | HR 73 | Temp 98.2°F | Resp 20 | Ht 62.0 in | Wt 156.7 lb

## 2012-03-11 DIAGNOSIS — R222 Localized swelling, mass and lump, trunk: Secondary | ICD-10-CM | POA: Insufficient documentation

## 2012-03-11 DIAGNOSIS — Z01810 Encounter for preprocedural cardiovascular examination: Secondary | ICD-10-CM | POA: Insufficient documentation

## 2012-03-11 DIAGNOSIS — Z01812 Encounter for preprocedural laboratory examination: Secondary | ICD-10-CM | POA: Insufficient documentation

## 2012-03-11 DIAGNOSIS — R918 Other nonspecific abnormal finding of lung field: Secondary | ICD-10-CM

## 2012-03-11 DIAGNOSIS — Z01818 Encounter for other preprocedural examination: Secondary | ICD-10-CM | POA: Insufficient documentation

## 2012-03-11 DIAGNOSIS — Z01811 Encounter for preprocedural respiratory examination: Secondary | ICD-10-CM | POA: Insufficient documentation

## 2012-03-11 HISTORY — DX: Other nonspecific abnormal finding of lung field: R91.8

## 2012-03-11 HISTORY — DX: Personal history of other diseases of the digestive system: Z87.19

## 2012-03-11 HISTORY — DX: Unspecified osteoarthritis, unspecified site: M19.90

## 2012-03-11 LAB — CBC
MCHC: 35.3 g/dL (ref 30.0–36.0)
Platelets: 192 10*3/uL (ref 150–400)
RDW: 12 % (ref 11.5–15.5)
WBC: 7.2 10*3/uL (ref 4.0–10.5)

## 2012-03-11 LAB — SURGICAL PCR SCREEN: Staphylococcus aureus: NEGATIVE

## 2012-03-11 LAB — COMPREHENSIVE METABOLIC PANEL
ALT: 11 U/L (ref 0–35)
AST: 18 U/L (ref 0–37)
Albumin: 3.7 g/dL (ref 3.5–5.2)
Alkaline Phosphatase: 60 U/L (ref 39–117)
Chloride: 106 mEq/L (ref 96–112)
Potassium: 4.1 mEq/L (ref 3.5–5.1)
Sodium: 138 mEq/L (ref 135–145)
Total Protein: 6.6 g/dL (ref 6.0–8.3)

## 2012-03-11 LAB — APTT: aPTT: 37 seconds (ref 24–37)

## 2012-03-11 LAB — URINALYSIS, ROUTINE W REFLEX MICROSCOPIC
Bilirubin Urine: NEGATIVE
Leukocytes, UA: NEGATIVE
Nitrite: NEGATIVE
Specific Gravity, Urine: 1.007 (ref 1.005–1.030)
Urobilinogen, UA: 0.2 mg/dL (ref 0.0–1.0)

## 2012-03-11 LAB — BLOOD GAS, ARTERIAL
Bicarbonate: 22.7 mEq/L (ref 20.0–24.0)
Drawn by: 206361
FIO2: 0.21 %
pCO2 arterial: 34.6 mmHg — ABNORMAL LOW (ref 35.0–45.0)
pH, Arterial: 7.432 (ref 7.350–7.450)
pO2, Arterial: 91.1 mmHg (ref 80.0–100.0)

## 2012-03-11 LAB — TYPE AND SCREEN: Antibody Screen: NEGATIVE

## 2012-03-11 LAB — PROTIME-INR: Prothrombin Time: 13.6 seconds (ref 11.6–15.2)

## 2012-03-11 NOTE — Progress Notes (Signed)
Pt. Reports that she had a pain medicine after Hysterectomy & she had a reaction of itching but doesn't remember the name of the medicine.

## 2012-03-11 NOTE — Progress Notes (Signed)
Left a msg. For [facility] to send records, stress/echo, EKG & OV note.

## 2012-03-11 NOTE — Pre-Procedure Instructions (Signed)
20 [name]  03/11/2012   Your procedure is scheduled on:  03/13/2012  Report to [facility] at 5:30 AM.  Call this number if you have problems the morning of surgery: [phone]   Remember:   Do not eat food or drink liquid:After Midnight.      Take these medicines the morning of surgery with A SIP OF WATER: Nexium, levothyroxine   Do not wear jewelry, make-up or nail polish.  Do not wear lotions, powders, or perfumes. You may wear deodorant.  Do not shave 48 hours prior to surgery. Men may shave face and neck.  Do not bring valuables to the hospital.  Contacts, dentures or bridgework may not be worn into surgery.  Leave suitcase in the car. After surgery it may be brought to your room.  For patients admitted to the hospital, checkout time is 11:00 AM the day of discharge.   Patients discharged the day of surgery will not be allowed to drive home.  Name and phone number of your driver: /w spouse  Special Instructions: Shower using CHG 2 nights before surgery and the night before surgery.  If you shower the day of surgery use CHG.  Use special wash - you have one bottle of CHG for all showers.  You should use approximately 1/3 of the bottle for each shower.   Please read over the following fact sheets that you were given: Pain Booklet, Coughing and Deep Breathing, Blood Transfusion Information, MRSA Information and Surgical Site Infection Prevention

## 2012-03-12 MED ORDER — DEXTROSE 5 % IV SOLN
1.5000 g | INTRAVENOUS | Status: AC
  Administered 2012-03-13: 1.5 g via INTRAVENOUS
  Filled 2012-03-12: qty 1.5

## 2012-03-12 NOTE — Consult Note (Signed)
Anesthesia chart review: Patient is a 77 year old female scheduled for flexible bronchoscopy, right thoracotomy, possible right upper lobectomy for right upper lobe lung mass on 03/13/2012 by Dr. Laneta Simmers.  Other history includes former heavy smoker, GERD, hypothyroidism, cystocele, hypercholesterolemia, burning mouth syndrome, COPD (PFT 02/08/12>>FEV1 1.63 (95%), FEV1% 65, TLC 4.12 (94%), DLCO 74%, no BD)   Pulmonologist is Dr. Coralyn Helling.  Cardiologist is Dr. Donato Schultz. PCP is listed as Dr. Carylon Perches.  EKG on 02/27/12 [facility]) showed NSR, cannot rule out anterior infarct (age undetermined), non-diagnostic Q waves in inferior leads.  Echo on 02/28/12 showed normal LV size and function, LVEF estimated at 60-65%, no regional wall motion abnormalities, mildly thickened mitral valve, trace mitral valve regurgitation, mild tricuspid regurgitation, mild calcification of the aortic valve, trace aortic valve regurgitation, Doppler findings suggesting grade 2 diastolic dysfunction without elevated left atrial pressure.  She had a low risk nuclear stress test on 02/28/12 that showed no ischemia and normal EF.  Based on above, Dr. Anne Fu felt patient "May proceed with surgery."  CXR on 03/11/12 showed a persistent right upper lobe opacity as previously noted and further evaluated with PET and CT. No other interval changes. No additional acute pathologic findings.   Labs noted.  Anticipate she can proceed as planned.  Shonna Chock, PA-C

## 2012-03-13 ENCOUNTER — Inpatient Hospital Stay (HOSPITAL_COMMUNITY)
Admission: RE | Admit: 2012-03-13 | Discharge: 2012-03-20 | DRG: 164 | Disposition: A | Discharge status: Home / Self Care | Payer: Medicare Other | Source: Ambulatory Visit | Attending: Surgery | Admitting: Surgery

## 2012-03-13 ENCOUNTER — Encounter (HOSPITAL_COMMUNITY): Payer: Self-pay | Admitting: Vascular Surgery

## 2012-03-13 ENCOUNTER — Encounter (HOSPITAL_COMMUNITY)
Admission: RE | Disposition: A | Discharge status: Home / Self Care | Payer: Self-pay | Source: Ambulatory Visit | Attending: Surgery

## 2012-03-13 ENCOUNTER — Inpatient Hospital Stay (HOSPITAL_COMMUNITY): Payer: Medicare Other | Admitting: Vascular Surgery

## 2012-03-13 ENCOUNTER — Encounter (HOSPITAL_COMMUNITY): Payer: Self-pay | Admitting: Surgery

## 2012-03-13 ENCOUNTER — Inpatient Hospital Stay (HOSPITAL_COMMUNITY): Payer: Medicare Other

## 2012-03-13 DIAGNOSIS — E7800 Pure hypercholesterolemia, unspecified: Secondary | ICD-10-CM | POA: Diagnosis present

## 2012-03-13 DIAGNOSIS — K219 Gastro-esophageal reflux disease without esophagitis: Secondary | ICD-10-CM | POA: Diagnosis present

## 2012-03-13 DIAGNOSIS — Y836 Removal of other organ (partial) (total) as the cause of abnormal reaction of the patient, or of later complication, without mention of misadventure at the time of the procedure: Secondary | ICD-10-CM | POA: Diagnosis present

## 2012-03-13 DIAGNOSIS — Z8249 Family history of ischemic heart disease and other diseases of the circulatory system: Secondary | ICD-10-CM

## 2012-03-13 DIAGNOSIS — I4891 Unspecified atrial fibrillation: Secondary | ICD-10-CM | POA: Diagnosis not present

## 2012-03-13 DIAGNOSIS — R222 Localized swelling, mass and lump, trunk: Principal | ICD-10-CM | POA: Diagnosis present

## 2012-03-13 DIAGNOSIS — Z79899 Other long term (current) drug therapy: Secondary | ICD-10-CM

## 2012-03-13 DIAGNOSIS — Z902 Acquired absence of lung [part of]: Secondary | ICD-10-CM

## 2012-03-13 DIAGNOSIS — J95812 Postprocedural air leak: Secondary | ICD-10-CM | POA: Diagnosis not present

## 2012-03-13 DIAGNOSIS — R918 Other nonspecific abnormal finding of lung field: Secondary | ICD-10-CM

## 2012-03-13 DIAGNOSIS — Z8049 Family history of malignant neoplasm of other genital organs: Secondary | ICD-10-CM

## 2012-03-13 DIAGNOSIS — Y9210 Unspecified residential institution as the place of occurrence of the external cause: Secondary | ICD-10-CM | POA: Diagnosis present

## 2012-03-13 DIAGNOSIS — E039 Hypothyroidism, unspecified: Secondary | ICD-10-CM | POA: Diagnosis present

## 2012-03-13 DIAGNOSIS — Z806 Family history of leukemia: Secondary | ICD-10-CM

## 2012-03-13 DIAGNOSIS — M722 Plantar fascial fibromatosis: Secondary | ICD-10-CM | POA: Diagnosis present

## 2012-03-13 DIAGNOSIS — J449 Chronic obstructive pulmonary disease, unspecified: Secondary | ICD-10-CM | POA: Diagnosis present

## 2012-03-13 DIAGNOSIS — Z800 Family history of malignant neoplasm of digestive organs: Secondary | ICD-10-CM

## 2012-03-13 DIAGNOSIS — Z87891 Personal history of nicotine dependence: Secondary | ICD-10-CM

## 2012-03-13 DIAGNOSIS — D381 Neoplasm of uncertain behavior of trachea, bronchus and lung: Secondary | ICD-10-CM

## 2012-03-13 DIAGNOSIS — Z01812 Encounter for preprocedural laboratory examination: Secondary | ICD-10-CM

## 2012-03-13 DIAGNOSIS — I2510 Atherosclerotic heart disease of native coronary artery without angina pectoris: Secondary | ICD-10-CM | POA: Diagnosis present

## 2012-03-13 DIAGNOSIS — J4489 Other specified chronic obstructive pulmonary disease: Secondary | ICD-10-CM | POA: Diagnosis present

## 2012-03-13 DIAGNOSIS — K589 Irritable bowel syndrome without diarrhea: Secondary | ICD-10-CM | POA: Diagnosis present

## 2012-03-13 HISTORY — PX: THORACOTOMY: SHX5074

## 2012-03-13 HISTORY — DX: Hemoptysis: R04.2

## 2012-03-13 HISTORY — PX: LYMPH NODE BIOPSY: SHX201

## 2012-03-13 HISTORY — DX: Migraine with aura, not intractable, without status migrainosus: G43.109

## 2012-03-13 HISTORY — PX: LOBECTOMY: SHX5089

## 2012-03-13 HISTORY — DX: Cardiac murmur, unspecified: R01.1

## 2012-03-13 HISTORY — PX: VIDEO BRONCHOSCOPY: SHX5072

## 2012-03-13 HISTORY — DX: Personal history of other diseases of the respiratory system: Z87.09

## 2012-03-13 LAB — GLUCOSE, CAPILLARY: Glucose-Capillary: 133 mg/dL — ABNORMAL HIGH (ref 70–99)

## 2012-03-13 SURGERY — BRONCHOSCOPY, VIDEO-ASSISTED
Anesthesia: General | Site: Mouth | Laterality: Right | Wound class: Clean Contaminated

## 2012-03-13 MED ORDER — POTASSIUM CHLORIDE 10 MEQ/50ML IV SOLN
10.0000 meq | Freq: Every day | INTRAVENOUS | Status: DC | PRN
  Filled 2012-03-13 (×2): qty 50

## 2012-03-13 MED ORDER — LEVOTHYROXINE SODIUM 75 MCG PO TABS
75.0000 ug | ORAL_TABLET | Freq: Every day | ORAL | Status: DC
  Administered 2012-03-14 – 2012-03-20 (×7): 75 ug via ORAL
  Filled 2012-03-13 (×9): qty 1

## 2012-03-13 MED ORDER — SODIUM CHLORIDE 0.9 % IJ SOLN: 9.0000 mL | INTRAMUSCULAR | Status: DC | PRN

## 2012-03-13 MED ORDER — ESTRADIOL 0.05 MG/24HR TD PTTW: 0.0500 mg | MEDICATED_PATCH | TRANSDERMAL | Status: DC

## 2012-03-13 MED ORDER — BUPIVACAINE HCL 0.5 % IJ SOLN
INTRAMUSCULAR | Status: DC | PRN
  Administered 2012-03-13: 5 mL

## 2012-03-13 MED ORDER — DIPHENHYDRAMINE HCL 12.5 MG/5ML PO ELIX
12.5000 mg | ORAL_SOLUTION | Freq: Four times a day (QID) | ORAL | Status: DC | PRN
  Filled 2012-03-13: qty 5

## 2012-03-13 MED ORDER — DIPHENHYDRAMINE HCL 50 MG/ML IJ SOLN: 12.5000 mg | Freq: Four times a day (QID) | INTRAMUSCULAR | Status: DC | PRN

## 2012-03-13 MED ORDER — OXYCODONE HCL 5 MG/5ML PO SOLN: 5.0000 mg | Freq: Once | ORAL | Status: DC | PRN

## 2012-03-13 MED ORDER — BUPIVACAINE ON-Q PAIN PUMP (FOR ORDER SET NO CHG)
INJECTION | Status: DC
  Filled 2012-03-13: qty 1

## 2012-03-13 MED ORDER — OXYCODONE HCL 5 MG PO TABS: 5.0000 mg | ORAL_TABLET | Freq: Once | ORAL | Status: DC | PRN

## 2012-03-13 MED ORDER — BISACODYL 5 MG PO TBEC
10.0000 mg | DELAYED_RELEASE_TABLET | Freq: Every day | ORAL | Status: DC
  Administered 2012-03-14 – 2012-03-19 (×4): 10 mg via ORAL
  Filled 2012-03-13 (×4): qty 2

## 2012-03-13 MED ORDER — MIDAZOLAM HCL 5 MG/5ML IJ SOLN
INTRAMUSCULAR | Status: DC | PRN
  Administered 2012-03-13 (×2): 2 mg via INTRAVENOUS

## 2012-03-13 MED ORDER — PROMETHAZINE HCL 25 MG/ML IJ SOLN
6.2500 mg | INTRAMUSCULAR | Status: AC | PRN
  Administered 2012-03-13 (×2): 6.25 mg via INTRAVENOUS

## 2012-03-13 MED ORDER — ONDANSETRON HCL 4 MG/2ML IJ SOLN
4.0000 mg | Freq: Four times a day (QID) | INTRAMUSCULAR | Status: DC | PRN
  Administered 2012-03-14: 4 mg via INTRAVENOUS
  Filled 2012-03-13 (×2): qty 2

## 2012-03-13 MED ORDER — NALOXONE HCL 0.4 MG/ML IJ SOLN: 0.4000 mg | INTRAMUSCULAR | Status: DC | PRN

## 2012-03-13 MED ORDER — BUPIVACAINE HCL (PF) 0.5 % IJ SOLN
INTRAMUSCULAR | Status: AC
  Filled 2012-03-13: qty 30

## 2012-03-13 MED ORDER — TRAMADOL HCL 50 MG PO TABS
50.0000 mg | ORAL_TABLET | Freq: Four times a day (QID) | ORAL | Status: DC | PRN
  Administered 2012-03-18 – 2012-03-20 (×4): 50 mg via ORAL
  Filled 2012-03-13 (×4): qty 1

## 2012-03-13 MED ORDER — MIDAZOLAM HCL 2 MG/2ML IJ SOLN: 0.5000 mg | Freq: Once | INTRAMUSCULAR | Status: DC | PRN

## 2012-03-13 MED ORDER — ROCURONIUM BROMIDE 100 MG/10ML IV SOLN
INTRAVENOUS | Status: DC | PRN
  Administered 2012-03-13: 50 mg via INTRAVENOUS

## 2012-03-13 MED ORDER — HYDROMORPHONE 0.3 MG/ML IV SOLN
INTRAVENOUS | Status: DC
  Administered 2012-03-13: 0.2 mg via INTRAVENOUS
  Administered 2012-03-13: 0.6 mg via INTRAVENOUS
  Administered 2012-03-14: 0.4 mg via INTRAVENOUS
  Administered 2012-03-14: 0.8 mg via INTRAVENOUS

## 2012-03-13 MED ORDER — 0.9 % SODIUM CHLORIDE (POUR BTL) OPTIME
TOPICAL | Status: DC | PRN
  Administered 2012-03-13: 3000 mL

## 2012-03-13 MED ORDER — HYDROMORPHONE 0.3 MG/ML IV SOLN
INTRAVENOUS | Status: AC
  Administered 2012-03-13: 11:00:00
  Filled 2012-03-13: qty 25

## 2012-03-13 MED ORDER — HEMOSTATIC AGENTS (NO CHARGE) OPTIME
TOPICAL | Status: DC | PRN
  Administered 2012-03-13: 1 via TOPICAL

## 2012-03-13 MED ORDER — ONDANSETRON HCL 4 MG/2ML IJ SOLN: 4.0000 mg | Freq: Four times a day (QID) | INTRAMUSCULAR | Status: DC | PRN

## 2012-03-13 MED ORDER — HYDROMORPHONE HCL PF 1 MG/ML IJ SOLN: 0.2500 mg | INTRAMUSCULAR | Status: DC | PRN

## 2012-03-13 MED ORDER — PROPOFOL 10 MG/ML IV BOLUS
INTRAVENOUS | Status: DC | PRN
  Administered 2012-03-13: 125 mg via INTRAVENOUS

## 2012-03-13 MED ORDER — NEOSTIGMINE METHYLSULFATE 1 MG/ML IJ SOLN
INTRAMUSCULAR | Status: DC | PRN
  Administered 2012-03-13: 5 mg via INTRAVENOUS

## 2012-03-13 MED ORDER — DEXTROSE 5 % IV SOLN
1.5000 g | Freq: Two times a day (BID) | INTRAVENOUS | Status: AC
  Administered 2012-03-13 – 2012-03-14 (×2): 1.5 g via INTRAVENOUS
  Filled 2012-03-13 (×2): qty 1.5

## 2012-03-13 MED ORDER — PROMETHAZINE HCL 25 MG/ML IJ SOLN
INTRAMUSCULAR | Status: AC
  Filled 2012-03-13: qty 1

## 2012-03-13 MED ORDER — ONDANSETRON HCL 4 MG/2ML IJ SOLN
4.0000 mg | Freq: Four times a day (QID) | INTRAMUSCULAR | Status: DC | PRN
  Administered 2012-03-13: 4 mg via INTRAVENOUS
  Filled 2012-03-13: qty 2

## 2012-03-13 MED ORDER — SENNOSIDES-DOCUSATE SODIUM 8.6-50 MG PO TABS
1.0000 | ORAL_TABLET | Freq: Every evening | ORAL | Status: DC | PRN
  Filled 2012-03-13: qty 1

## 2012-03-13 MED ORDER — LACTATED RINGERS IV SOLN
INTRAVENOUS | Status: DC | PRN
  Administered 2012-03-13: 07:00:00 via INTRAVENOUS

## 2012-03-13 MED ORDER — INSULIN ASPART 100 UNIT/ML ~~LOC~~ SOLN
0.0000 [IU] | SUBCUTANEOUS | Status: DC
  Administered 2012-03-13 – 2012-03-15 (×5): 2 [IU] via SUBCUTANEOUS

## 2012-03-13 MED ORDER — PHENYLEPHRINE HCL 10 MG/ML IJ SOLN
10.0000 mg | INTRAVENOUS | Status: DC | PRN
  Administered 2012-03-13: 10 ug/min via INTRAVENOUS

## 2012-03-13 MED ORDER — VECURONIUM BROMIDE 10 MG IV SOLR
INTRAVENOUS | Status: DC | PRN
  Administered 2012-03-13: 1 mg via INTRAVENOUS
  Administered 2012-03-13: 3 mg via INTRAVENOUS

## 2012-03-13 MED ORDER — LIDOCAINE HCL (CARDIAC) 20 MG/ML IV SOLN
INTRAVENOUS | Status: DC | PRN
  Administered 2012-03-13: 20 mg via INTRAVENOUS

## 2012-03-13 MED ORDER — ARTIFICIAL TEARS OP OINT
TOPICAL_OINTMENT | OPHTHALMIC | Status: DC | PRN
  Administered 2012-03-13: 1 via OPHTHALMIC

## 2012-03-13 MED ORDER — FENTANYL CITRATE 0.05 MG/ML IJ SOLN
INTRAMUSCULAR | Status: DC | PRN
  Administered 2012-03-13: 100 ug via INTRAVENOUS
  Administered 2012-03-13 (×3): 50 ug via INTRAVENOUS
  Administered 2012-03-13: 200 ug via INTRAVENOUS
  Administered 2012-03-13 (×2): 50 ug via INTRAVENOUS

## 2012-03-13 MED ORDER — HYDROMORPHONE 0.3 MG/ML IV SOLN
INTRAVENOUS | Status: DC
  Administered 2012-03-13: 2.4 mg via INTRAVENOUS
  Administered 2012-03-13: 1.2 mg via INTRAVENOUS

## 2012-03-13 MED ORDER — BUPIVACAINE 0.5 % ON-Q PUMP SINGLE CATH 400 ML
INJECTION | Status: DC | PRN
  Administered 2012-03-13: 400 mL

## 2012-03-13 MED ORDER — MEPERIDINE HCL 25 MG/ML IJ SOLN: 6.2500 mg | INTRAMUSCULAR | Status: DC | PRN

## 2012-03-13 MED ORDER — GLYCOPYRROLATE 0.2 MG/ML IJ SOLN
INTRAMUSCULAR | Status: DC | PRN
  Administered 2012-03-13: .8 mg via INTRAVENOUS

## 2012-03-13 MED ORDER — LEVALBUTEROL HCL 0.63 MG/3ML IN NEBU
0.6300 mg | INHALATION_SOLUTION | Freq: Four times a day (QID) | RESPIRATORY_TRACT | Status: DC
  Administered 2012-03-13 – 2012-03-15 (×8): 0.63 mg via RESPIRATORY_TRACT
  Filled 2012-03-13 (×12): qty 3

## 2012-03-13 MED ORDER — BUPIVACAINE 0.5 % ON-Q PUMP SINGLE CATH 400 ML
400.0000 mL | INJECTION | Status: DC
  Filled 2012-03-13: qty 400

## 2012-03-13 MED ORDER — BUPIVACAINE-EPINEPHRINE PF 0.25-1:200000 % IJ SOLN
INTRAMUSCULAR | Status: AC
  Filled 2012-03-13: qty 30

## 2012-03-13 MED ORDER — ONDANSETRON HCL 4 MG/2ML IJ SOLN
INTRAMUSCULAR | Status: DC | PRN
  Administered 2012-03-13: 4 mg via INTRAVENOUS

## 2012-03-13 MED ORDER — KCL IN DEXTROSE-NACL 20-5-0.45 MEQ/L-%-% IV SOLN
INTRAVENOUS | Status: DC
  Administered 2012-03-13 – 2012-03-15 (×2): via INTRAVENOUS
  Administered 2012-03-15: 75 mL/h via INTRAVENOUS
  Administered 2012-03-16: 12:00:00 via INTRAVENOUS
  Filled 2012-03-13 (×9): qty 1000

## 2012-03-13 SURGICAL SUPPLY — 97 items
ADH SKN CLS LQ APL DERMABOND (GAUZE/BANDAGES/DRESSINGS) ×2
APL SKNCLS STERI-STRIP NONHPOA (GAUZE/BANDAGES/DRESSINGS) ×2
BALL CTTN LRG ABS STRL LF (GAUZE/BANDAGES/DRESSINGS)
BENZOIN TINCTURE PRP APPL 2/3 (GAUZE/BANDAGES/DRESSINGS) ×1 IMPLANT
BRUSH CYTOL CELLEBRITY 1.5X140 (MISCELLANEOUS) IMPLANT
CANISTER SUCTION 2500CC (MISCELLANEOUS) ×5 IMPLANT
CATH KIT ON Q 5IN SLV (PAIN MANAGEMENT) ×1 IMPLANT
CATH THORACIC 28FR (CATHETERS) ×1 IMPLANT
CATH THORACIC 36FR (CATHETERS) IMPLANT
CATH THORACIC 36FR RT ANG (CATHETERS) IMPLANT
CLIP TI MEDIUM 6 (CLIP) ×2 IMPLANT
CLOTH BEACON ORANGE TIMEOUT ST (SAFETY) ×3 IMPLANT
CLSR STERI-STRIP ANTIMIC 1/2X4 (GAUZE/BANDAGES/DRESSINGS) ×1 IMPLANT
CONN ST 1/4X3/8  BEN (MISCELLANEOUS) ×1
CONN ST 1/4X3/8 BEN (MISCELLANEOUS) IMPLANT
CONN Y 3/8X3/8X3/8  BEN (MISCELLANEOUS) ×1
CONN Y 3/8X3/8X3/8 BEN (MISCELLANEOUS) IMPLANT
CONT SPEC 4OZ CLIKSEAL STRL BL (MISCELLANEOUS) ×7 IMPLANT
COTTONBALL LRG STERILE PKG (GAUZE/BANDAGES/DRESSINGS) IMPLANT
COVER SURGICAL LIGHT HANDLE (MISCELLANEOUS) ×5 IMPLANT
COVER TABLE BACK 60X90 (DRAPES) ×3 IMPLANT
DERMABOND ADHESIVE PROPEN (GAUZE/BANDAGES/DRESSINGS) ×1
DERMABOND ADVANCED .7 DNX6 (GAUZE/BANDAGES/DRESSINGS) IMPLANT
DRAIN CHANNEL 28F RND 3/8 FF (WOUND CARE) ×1 IMPLANT
DRAPE LAPAROSCOPIC ABDOMINAL (DRAPES) ×3 IMPLANT
DRAPE WARM FLUID 44X44 (DRAPE) ×5 IMPLANT
ELECT REM PT RETURN 9FT ADLT (ELECTROSURGICAL) ×3
ELECTRODE REM PT RTRN 9FT ADLT (ELECTROSURGICAL) ×2 IMPLANT
FORCEPS BIOP RJ4 1.8 (CUTTING FORCEPS) IMPLANT
GLOVE BIO SURGEON STRL SZ 6 (GLOVE) ×1 IMPLANT
GLOVE BIOGEL PI IND STRL 6.5 (GLOVE) IMPLANT
GLOVE BIOGEL PI IND STRL 7.0 (GLOVE) IMPLANT
GLOVE BIOGEL PI IND STRL 7.5 (GLOVE) IMPLANT
GLOVE BIOGEL PI INDICATOR 6.5 (GLOVE)
GLOVE BIOGEL PI INDICATOR 7.0 (GLOVE) ×3
GLOVE BIOGEL PI INDICATOR 7.5 (GLOVE) ×2
GLOVE EUDERMIC 7 POWDERFREE (GLOVE) ×6 IMPLANT
GLOVE SURG SS PI 6.0 STRL IVOR (GLOVE) ×1 IMPLANT
GLOVE SURG SS PI 7.5 STRL IVOR (GLOVE) ×2 IMPLANT
GOWN PREVENTION PLUS XLARGE (GOWN DISPOSABLE) ×5 IMPLANT
GOWN STRL NON-REIN LRG LVL3 (GOWN DISPOSABLE) ×6 IMPLANT
HANDLE STAPLE ENDO GIA SHORT (STAPLE) ×1
KIT BASIN OR (CUSTOM PROCEDURE TRAY) ×3 IMPLANT
KIT ROOM TURNOVER OR (KITS) ×3 IMPLANT
MARKER SKIN DUAL TIP RULER LAB (MISCELLANEOUS) ×2 IMPLANT
NDL BIOPSY TRANSBRONCH 21G (NEEDLE) IMPLANT
NEEDLE 22X1 1/2 (OR ONLY) (NEEDLE) IMPLANT
NEEDLE BIOPSY TRANSBRONCH 21G (NEEDLE) IMPLANT
NS IRRIG 1000ML POUR BTL (IV SOLUTION) ×7 IMPLANT
OIL SILICONE PENTAX (PARTS (SERVICE/REPAIRS)) ×3 IMPLANT
PACK CHEST (CUSTOM PROCEDURE TRAY) ×3 IMPLANT
PAD ARMBOARD 7.5X6 YLW CONV (MISCELLANEOUS) ×6 IMPLANT
RELOAD EGIA 60 MED/THCK PURPLE (STAPLE) ×6 IMPLANT
RELOAD EGIA TRIS TAN 45 CVD (STAPLE) ×15 IMPLANT
RELOAD STAPLE 45 TAN MED CVD (STAPLE) IMPLANT
RELOAD STAPLE 60 MED/THCK ART (STAPLE) IMPLANT
SEALANT PROGEL (MISCELLANEOUS) IMPLANT
SEALANT SURG COSEAL 4ML (VASCULAR PRODUCTS) ×1 IMPLANT
SEALANT SURG COSEAL 8ML (VASCULAR PRODUCTS) IMPLANT
SOLUTION ANTI FOG 6CC (MISCELLANEOUS) ×3 IMPLANT
SPONGE GAUZE 4X4 12PLY (GAUZE/BANDAGES/DRESSINGS) ×4 IMPLANT
STAPLER ENDO GIA 12 SHRT THIN (STAPLE) IMPLANT
STAPLER ENDO GIA 12MM SHORT (STAPLE) ×2 IMPLANT
STAPLER TA30 4.8 NON-ABS (STAPLE) ×1 IMPLANT
SUT PROLENE 3 0 SH DA (SUTURE) IMPLANT
SUT SILK  1 MH (SUTURE) ×2
SUT SILK 1 MH (SUTURE) ×4 IMPLANT
SUT SILK 2 0 SH (SUTURE) ×3 IMPLANT
SUT SILK 2 0SH CR/8 30 (SUTURE) ×1 IMPLANT
SUT SILK 3 0SH CR/8 30 (SUTURE) IMPLANT
SUT VIC AB 1 CTX 36 (SUTURE) ×3
SUT VIC AB 1 CTX36XBRD ANBCTR (SUTURE) ×2 IMPLANT
SUT VIC AB 2-0 CT1 27 (SUTURE) ×3
SUT VIC AB 2-0 CT1 TAPERPNT 27 (SUTURE) IMPLANT
SUT VIC AB 2-0 CTX 36 (SUTURE) ×3 IMPLANT
SUT VIC AB 2-0 UR6 27 (SUTURE) IMPLANT
SUT VIC AB 3-0 MH 27 (SUTURE) ×1 IMPLANT
SUT VIC AB 3-0 SH 27 (SUTURE) ×6
SUT VIC AB 3-0 SH 27X BRD (SUTURE) IMPLANT
SUT VIC AB 3-0 X1 27 (SUTURE) ×3 IMPLANT
SUT VICRYL 2 TP 1 (SUTURE) ×3 IMPLANT
SYR 20ML ECCENTRIC (SYRINGE) ×2 IMPLANT
SYR 5ML LUER SLIP (SYRINGE) ×2 IMPLANT
SYR BULB IRRIGATION 50ML (SYRINGE) ×1 IMPLANT
SYR CONTROL 10ML LL (SYRINGE) IMPLANT
SYSTEM SAHARA CHEST DRAIN ATS (WOUND CARE) ×3 IMPLANT
TAPE CLOTH SURG 4X10 WHT LF (GAUZE/BANDAGES/DRESSINGS) ×1 IMPLANT
TIP APPLICATOR SPRAY EXTEND 16 (VASCULAR PRODUCTS) IMPLANT
TOWEL OR 17X24 6PK STRL BLUE (TOWEL DISPOSABLE) ×3 IMPLANT
TOWEL OR 17X26 10 PK STRL BLUE (TOWEL DISPOSABLE) ×3 IMPLANT
TRAP SPECIMEN MUCOUS 40CC (MISCELLANEOUS) ×2 IMPLANT
TRAY FOLEY CATH 14FRSI W/METER (CATHETERS) ×3 IMPLANT
TUBE CONNECTING 12X1/4 (SUCTIONS) ×2 IMPLANT
VALVE BIOPSY  SINGLE USE (MISCELLANEOUS)
VALVE BIOPSY SINGLE USE (MISCELLANEOUS) IMPLANT
VALVE SUCTION BRONCHIO DISP (MISCELLANEOUS) IMPLANT
WATER STERILE IRR 1000ML POUR (IV SOLUTION) ×6 IMPLANT

## 2012-03-13 NOTE — Preoperative (Signed)
Beta Blockers   Reason not to administer Beta Blockers:Not Applicable 

## 2012-03-13 NOTE — Transfer of Care (Signed)
Immediate Anesthesia Transfer of Care Note  Patient: KENDAHL BUMGARDNER  Procedure(s) Performed: Procedure(s) (LRB) with comments: VIDEO BRONCHOSCOPY (N/A) THORACOTOMY MAJOR (Right) - Right Thoracotomy, Right Upper Lobectomy, and lymphnode sampling LOBECTOMY (Right) - Right Thoracotomy, Right Upper Lobectomy, and lymphnode sampling LYMPH NODE BIOPSY (Right) - Right Thoracotomy, Right Upper Lobectomy, and lymphnode sampling  Patient Location: PACU  Anesthesia Type: General  Level of Consciousness: awake, alert  and oriented  Airway & Oxygen Therapy: Patient Spontanous Breathing and Patient connected to face mask oxygen  Post-op Assessment: Report given to PACU RN and Post -op Vital signs reviewed and stable  Post vital signs: Reviewed and stable  Complications: No apparent anesthesia complications

## 2012-03-13 NOTE — OR Nursing (Signed)
Procedure 1 Bronchoscopy start at 0806 end at 0810. Procedure 2 Right Thoracotomy start at 0847 end at 1052.

## 2012-03-13 NOTE — Progress Notes (Signed)
Utilization Review Completed.[name] T10/17/2013   

## 2012-03-13 NOTE — Anesthesia Procedure Notes (Addendum)
Procedure Name: Intubation Date/Time: 03/13/2012 8:32 AM Performed by: Gayla Medicus Pre-anesthesia Checklist: Patient identified, Timeout performed, Emergency Drugs available, Suction available and Patient being monitored Patient Re-evaluated:Patient Re-evaluated prior to inductionOxygen Delivery Method: Circle system utilized Preoxygenation: Pre-oxygenation with 100% oxygen Intubation Type: IV induction Ventilation: Mask ventilation without difficulty Laryngoscope Size: Mac and 3 Grade View: Grade I Tube type: Oral Endobronchial tube: Left and EBT position confirmed by fiberoptic bronchoscope and 37 Fr Number of attempts: 1 Airway Equipment and Method: Stylet Placement Confirmation: ETT inserted through vocal cords under direct vision,  positive ETCO2 and breath sounds checked- equal and bilateral Tube secured with: Tape Dental Injury: Teeth and Oropharynx as per pre-operative assessment     CVP: Timeout, sterile prep, drape, FBP R neck.  Trendelenburg position.  1% lido local, finder and trocar RIJ 1st pass with US guidance.  2 lumen placed over J wire. Biopatch and sterile dressing on.  Patient tolerated well.  VSS.  Sandford Craze, MD  [phone]

## 2012-03-13 NOTE — Interval H&P Note (Signed)
History and Physical Interval Note:  03/13/2012 7:28 AM  [name]  has presented today for surgery, with the diagnosis of RUL lung mass  The various methods of treatment have been discussed with the patient and family. After consideration of risks, benefits and other options for treatment, the patient has consented to  Procedure(s) (LRB) with comments: VIDEO BRONCHOSCOPY (N/A) THORACOTOMY MAJOR (Right) LOBECTOMY (Right) - possible right upper lobectomy as a surgical intervention .  The patient's history has been reviewed, patient examined, no change in status, stable for surgery.  I have reviewed the patient's chart and labs.  Questions were answered to the patient's satisfaction.     [name]

## 2012-03-13 NOTE — H&P (Signed)
[address]            [place] [address]          [phone]      PCP is Carylon Perches, MD  Referring Provider is Coralyn Helling, MD   Chief Complaint   Patient presents with   .  Lung Lesion     MTOC/ RUL nodule, PET Scan 02/15/12, Chest CT 01/03/12    HPI:  The patient is a 77 year old former heavy smoker who reports noting some blood-streaked secretions in the back of her throat after coughing. This started over the summer. She had a chest x-ray that showed a right upper lobe lung nodule. CT scan of the chest confirmed a 14 x 9 mm nodule in the apex of the right upper lobe with some spiculation suspicious for bronchogenic carcinoma. There is no mediastinal or hilar adenopathy. She did have some calcification of the thoracic aorta as well as the great vessels in the mediastinum and the coronary arteries, particularly the LAD. A PET scan showed this lesion to be hypermetabolic within maximum SUV of 3.4. No other hypermetabolic activity was seen. She does have a history of GI reflux and was seen by ENT and underwent flexible laryngoscopy showing edema of the vocal cords. It was felt that this was likely due to reflux. She has been on Nexium for years.  Past Medical History   Diagnosis  Date   .  Urinary incontinence    .  Cystocele    .  Fibroid    .  GERD (gastroesophageal reflux disease)    .  Lesion of right lung    .  Hypercholesteremia    .  Hypothyroidism    .  Post menopausal syndrome    .  Burning mouth syndrome    .  Plantar fascial fibromatosis    .  COPD (chronic obstructive pulmonary disease)  02/12/2012     PFT 02/08/12>>FEV1 1.63 (95%), FEV1% 65, TLC 4.12 (94%), DLCO 74%, no BD    Past Surgical History   Procedure  Date   .  Cataract extraction    .  Bladder suspension      Anterior repair   .  Abdominal hysterectomy  1980   .  Colonoscopy    .  Upper gastrointestinal endoscopy     Family History   Problem  Relation  Age of Onset     .  Uterine cancer  Mother    .  Heart disease  Mother    .  Osteoporosis  Mother    .  Pancreatic cancer  Father       pancreatic    .  Pancreatic cancer  Father    .  Multiple sclerosis  Daughter    .  Cancer  Maternal Aunt       Oral cancer    .  Other  Mother       abdominal aortic aneurysm    .  Other  Mother       chronic lymphoid leukemia    .  Polycythemia  Father    Social History  History   Substance Use Topics   .  Smoking status:  Former Smoker -- 2.0 packs/day for 25 years     Types:  Cigarettes     Quit date:  06/03/1976   .  Smokeless tobacco:  Never Used   .  Alcohol Use:  Yes      occasional wine    Current Outpatient Prescriptions   Medication  Sig  Dispense  Refill   .  CELEBREX 200 MG capsule  Take 200 mg by mouth as needed. For pain     .  esomeprazole (NEXIUM) 40 MG capsule  Take 1 capsule (40 mg total) by mouth daily.  30 capsule  11   .  estradiol (VIVELLE-DOT) 0.05 MG/24HR  Place 1 patch (0.05 mg total) onto the skin 2 (two) times a week.  26 patch  3   .  ESTRADIOL VA  Place 1 applicator vaginally. Twice a week     .  FIBER PO  Take 2 Units by mouth daily. 2 units=2 teaspoonfuls     .  levothyroxine (SYNTHROID, LEVOTHROID) 75 MCG tablet  Take 75 mcg by mouth daily.     [name]  loratadine (CLARITIN) 10 MG tablet  Take 10 mg by mouth daily.     .  Misc Natural Products (TART CHERRY ADVANCED) CAPS  Take by mouth. Patient takes this Black Cherry 250 mg Capsule twice daily     .  mometasone (NASONEX) 50 MCG/ACT nasal spray  Place 2 sprays into the nose as needed. For allergies     .  Multiple Vitamin (MULTIVITAMIN) capsule  Take 1 capsule by mouth daily.     .  Probiotic Product (TRUBIOTICS PO)  Take 1 capsule by mouth daily.      Allergies   Allergen  Reactions   .  Codeine  Nausea And Vomiting   Review of Systems  Constitutional: Negative for activity change, appetite change, fatigue and unexpected weight change.  HENT:  Feels like a lump in her  throat after swallowing.  Eyes: Negative.  Respiratory: Positive for cough. Negative for shortness of breath.  Cardiovascular: Negative.  Gastrointestinal:  Reflux  Genitourinary: Negative.  Musculoskeletal: Negative.  Skin: Negative.  Neurological: Negative.  Hematological: Negative.  Psychiatric/Behavioral: Negative.  BP 119/81  Pulse 78  Temp 97.9 F (36.6 C) (Oral)  Resp 18  Ht 5' 1.5" (1.562 m)  Wt 156 lb (70.761 kg)  BMI 29.00 kg/m2  SpO2 95%  Physical Exam  Constitutional: She is oriented to person, place, and time. She appears well-developed and well-nourished.  HENT:  Head: Normocephalic and atraumatic.  Mouth/Throat: Oropharynx is clear and moist.  Eyes: EOM are normal. Pupils are equal, round, and reactive to light.  Neck: Normal range of motion. Neck supple. No JVD present. No tracheal deviation present. No thyromegaly present.  Cardiovascular: Normal rate, regular rhythm, normal heart sounds and intact distal pulses. Exam reveals no gallop and no friction rub.  No murmur heard.  Pulmonary/Chest: Effort normal and breath sounds normal. No respiratory distress. She has no wheezes. She has no rales.  Abdominal: Soft. Bowel sounds are normal. She exhibits no distension and no mass. There is no tenderness.  Musculoskeletal: She exhibits no edema.  Lymphadenopathy:  She has no cervical adenopathy.  Neurological: She is alert and oriented to person, place, and time. She has normal strength. No cranial nerve deficit or sensory deficit.  Skin: Skin is warm and dry.  Psychiatric: She has a normal mood and affect.  Diagnostic Tests:  *RADIOLOGY REPORT*  Clinical Data: Initial treatment strategy for right pulmonary  nodule.  NUCLEAR MEDICINE PET SKULL BASE TO THIGH  Fasting Blood Glucose: 111  Technique: 18.6 mCi F-18 FDG was injected intravenously. CT data  was obtained and used for attenuation correction and anatomic  localization only. (This was not acquired as a  diagnostic CT  examination.) Additional exam technical data entered on  technologist worksheet.  Comparison: 01/03/2012  Findings:  Neck: No hypermetabolic lymph nodes in the neck.  Chest: Spiculated 1.3 cm peripheral right apical lung nodule  maximum standard uptake value is 3.4. No hypermetabolic adenopathy  in the chest observed. Muscular activity noted within or adjacent  to the right subscapularis muscle, highly likely to be physiologic  and incidental. Coronary artery atherosclerosis noted.  Abdomen/Pelvis: No abnormal hypermetabolic activity within the  liver, pancreas, adrenal glands, or spleen. No hypermetabolic  lymph nodes in the abdomen or pelvis. Mild aortoiliac  atherosclerotic vascular disease noted. Small umbilical hernia  contains adipose tissue. Lumbar spondylosis noted.  Skeleton: No focal hypermetabolic activity to suggest skeletal  metastasis.  IMPRESSION:  1. The spiculated 1.3 cm right apical nodule has a maximum  standard uptake value of 3.4, compatible with malignancy or less  likely active granulomatous process. No hypermetabolic adenopathy  noted.  2. Coronary artery atherosclerosis.  Original Report Authenticated By: Dellia Cloud, M.D.  *RADIOLOGY REPORT*  Clinical Data: Hemoptysis. Prior history of smoking. Possible  nodule noted on recent chest x-ray.  CT CHEST WITHOUT CONTRAST 01/03/2012  Technique: Multidetector CT imaging of the chest was performed  following the standard protocol without IV contrast.  Comparison: Chest x-ray 12/28/2011.  Findings:  Mediastinum: Heart size is normal. There is no significant  pericardial fluid, thickening or pericardial calcification. There  is atherosclerosis of the thoracic aorta, the great vessels of the  mediastinum and the coronary arteries, including calcified  atherosclerotic plaque in the left anterior descending coronary  artery. Faint calcifications of the aortic valve. No pathologically  enlarged  mediastinal or hilar lymph nodes. Please note that  accurate exclusion of hilar adenopathy is limited on noncontrast CT  scans. Esophagus is unremarkable in appearance.  Lungs/Pleura: In the periphery of the right apex there is a pleural-  based subsolid nodule that measures 14 x 9 mm (central solid  component measures 10 mm in greatest length) as measured on images  16 of series 3 and 2. This lesion has slightly spiculated margins  and some mild overlying pleural retraction. No acute consolidative  airspace disease. No pleural effusions.  Upper Abdomen: Unremarkable.  Musculoskeletal: There are no aggressive appearing lytic or blastic  lesions noted in the visualized portions of the skeleton.  Levoscoliosis of the thoracic spine centered at the T5.  IMPRESSION:  1. Subsolid nodule in the apex of the right upper lobe measuring  14 x 9 mm (central solid component 10 mm in greatest length). This  is suspicious for, but not definitive for, a small adenocarcinoma;  although an area of scarring could have a similar appearance. At  this time, a follow-up CT scan of the thorax in 3 months is  recommended to confirm persistence or document resolution. This  recommendation follows the consensus statement: Recommendations for  the Management of Subsolid Pulmonary Nodules Detected at CT: A  Statement from the Fleischner Society as published in Radiology  2013; 266:304-317.  2. Atherosclerosis, including left anterior descending coronary  artery disease.  3. Levoscoliosis of the thoracic spine.  Original Report Authenticated By: Florencia Reasons, M.D.  Impression:  She has a 14 x 9 mm spiculated, nodular density in the peripheral aspect of the right upper lobe that has low-level hypermetabolic activity on PET scan and is very suspicious for a bronchogenic carcinoma, probably adenocarcinoma. I think the best treatment for this is going to be  right thoracotomy with wedge resection of the lesion followed by frozen section. If this is a carcinoma I will plan to proceed with right upper lobectomy and mediastinal lymph node dissection. She said that she has had pulmonary function tests done recently and they were felt to be adequate to tolerate surgery. I will get a copy of these to be sure that she would tolerate a right upper lobectomy. I will also obtain an MRI of the brain to rule out intracranial metastasis. She has multiple cardiac risk factors and coronary calcification noted on CT scan. She does not admit to any cardiac symptoms but I think it would be best to have her see cardiology preoperatively for evaluation. After these things had been completed we will schedule her surgery as quickly as possible. I reviewed the CT and PET scan with her and her family and discussed the recommended plans as mentioned above. I discussed the operative procedure including alternatives, benefits, and risks including but not limited to bleeding, blood transfusion, infection, bronchial stump complications, prolonged air leak, respiratory failure, the possible need for adjuvant chemotherapy if she has any positive lymph nodes, and the risk of cancer recurrence. She understands all this and agrees to proceed.  Plan:  1. Review pulmonary function tests.  2. MRI of the brain.  3. Cardiology consultation with [facility] cardiology.  4. Schedule surgery after these had been completed, hopefully within 2 weeks.   Addendum:  PFT's acceptable for right upper lobectomy. MRI of brain negative. Cardiology consultation obtained and stress test showed no ischemia.

## 2012-03-13 NOTE — Anesthesia Postprocedure Evaluation (Signed)
  Anesthesia Post-op Note  Patient: [name]  Procedure(s) Performed: Procedure(s) (LRB) with comments: VIDEO BRONCHOSCOPY (N/A) THORACOTOMY MAJOR (Right) - Right Thoracotomy, Right Upper Lobectomy, and lymphnode sampling LOBECTOMY (Right) - Right Thoracotomy, Right Upper Lobectomy, and lymphnode sampling LYMPH NODE BIOPSY (Right) - Right Thoracotomy, Right Upper Lobectomy, and lymphnode sampling  Patient Location: PACU  Anesthesia Type: General  Level of Consciousness: awake, alert , oriented and patient cooperative  Airway and Oxygen Therapy: Patient Spontanous Breathing and Patient connected to nasal cannula oxygen  Post-op Pain: mild  Post-op Assessment: Post-op Vital signs reviewed, Patient's Cardiovascular Status Stable, Respiratory Function Stable, Patent Airway, No signs of Nausea or vomiting and Pain level controlled  Post-op Vital Signs: Reviewed and stable  Complications: No apparent anesthesia complications

## 2012-03-13 NOTE — Brief Op Note (Signed)
03/13/2012  10:39 AM  PATIENT:  [name]  77 y.o. female  PRE-OPERATIVE DIAGNOSIS:  Right Upper Lobe Mass  POST-OPERATIVE DIAGNOSIS:  Right Upper Lobe Mass  PROCEDURE:  Procedure(s) (LRB) with comments: VIDEO BRONCHOSCOPY (N/A) THORACOTOMY MAJOR (Right) - Right Thoracotomy, Right Upper Lobectomy, and mediastinal lymph node dissection  SURGEON:  Surgeon(s) and Role:    * [name], MD - Primary  PHYSICIAN ASSISTANT: Lowella Dandy, PA-C  ASSISTANTS: none   ANESTHESIA:   General  EBL:  Total I/O In: -  Out: 650 [Urine:550; Blood:100]  BLOOD ADMINISTERED:none  DRAINS: 51F Blake posteriorly and 51F chest tube anteriorly  LOCAL MEDICATIONS USED:  NONE  SPECIMEN:  Source of Specimen:  Right upper lobe R10 and R11 lymph nodes  DISPOSITION OF SPECIMEN:  PATHOLOGY  COUNTS:  YES   PLAN OF CARE: Admit to inpatient   PATIENT DISPOSITION:  PACU - hemodynamically stable.   Delay start of Pharmacological VTE agent (>24hrs) due to surgical blood loss or risk of bleeding: yes

## 2012-03-13 NOTE — Anesthesia Preprocedure Evaluation (Addendum)
Anesthesia Evaluation  Patient identified by MRN, date of birth, ID band Patient awake    Reviewed: Allergy & Precautions, H&P , NPO status , Patient's Chart, lab work & pertinent test results  History of Anesthesia Complications Negative for: history of anesthetic complications  Airway Mallampati: I TM Distance: >3 FB Neck ROM: Full    Dental  (+) Teeth Intact and Dental Advisory Given   Pulmonary COPDformer smoker,  breath sounds clear to auscultation  Pulmonary exam normal       Cardiovascular - CAD Rhythm:Regular Rate:Normal  10/13 stress test: no ischemia, normal perfusion, low risk   Neuro/Psych negative neurological ROS  negative psych ROS   GI/Hepatic Neg liver ROS, GERD-  Medicated and Controlled,  Endo/Other  Hypothyroidism (on replacement)   Renal/GU negative Renal ROS     Musculoskeletal   Abdominal   Peds  Hematology   Anesthesia Other Findings   Reproductive/Obstetrics                          Anesthesia Physical Anesthesia Plan  ASA: III  Anesthesia Plan: General   Post-op Pain Management:    Induction: Intravenous  Airway Management Planned: Oral ETT and Double Lumen EBT  Additional Equipment: Arterial line and CVP  Intra-op Plan:   Post-operative Plan: Extubation in OR  Informed Consent: I have reviewed the patients History and Physical, chart, labs and discussed the procedure including the risks, benefits and alternatives for the proposed anesthesia with the patient or authorized representative who has indicated his/her understanding and acceptance.   Dental advisory given  Plan Discussed with: Surgeon and CRNA  Anesthesia Plan Comments: (Plan routine monitors, A line, Central line, GETA)        Anesthesia Quick Evaluation

## 2012-03-13 NOTE — Progress Notes (Signed)
Day of Surgery Procedure(s) (LRB): VIDEO BRONCHOSCOPY (N/A) THORACOTOMY MAJOR (Right) LOBECTOMY (Right) LYMPH NODE BIOPSY (Right) Subjective:resting comfortably post RUL lobettiomy No bleeding, air leak Objective: Vital signs in last 24 hours: Temp:  [96.3 F (35.7 C)-97.7 F (36.5 C)] 96.3 F (35.7 C) (10/17 1626) Pulse Rate:  [45-84] 54  (10/17 1400) Cardiac Rhythm:  [-]  Resp:  [8-18] 14  (10/17 1400) BP: (82-119)/(47-77) 101/61 mmHg (10/17 1400) SpO2:  [96 %-100 %] 100 % (10/17 1400) Arterial Line BP: (99-108)/(44-57) 108/54 mmHg (10/17 1400)  Hemodynamic parameters for last 24 hours:   nsr Intake/Output from previous day:   Intake/Output this shift: Total I/O In: 1912 [I.V.:1912] Out: 954 [Urine:690; Blood:100; Chest Tube:164]  Lungs clear  Lab Results:  Basename 03/11/12 1359  WBC 7.2  HGB 15.2*  HCT 43.0  PLT 192   BMET:  Basename 03/11/12 1359  NA 138  K 4.1  CL 106  CO2 23  GLUCOSE 96  BUN 11  CREATININE 0.63  CALCIUM 9.0    PT/INR:  Basename 03/11/12 1359  LABPROT 13.6  INR 1.05   ABG    Component Value Date/Time   PHART 7.432 03/11/2012 1358   HCO3 22.7 03/11/2012 1358   TCO2 23.8 03/11/2012 1358   ACIDBASEDEF 1.0 03/11/2012 1358   O2SAT 97.4 03/11/2012 1358   CBG (last 3)   Basename 03/13/12 1619  GLUCAP 133*    Assessment/Plan: S/P Procedure(s) (LRB): VIDEO BRONCHOSCOPY (N/A) THORACOTOMY MAJOR (Right) LOBECTOMY (Right) LYMPH NODE BIOPSY (Right)  Patient examined and record reviewed.Hemodynamics stable,labs satisfactory..Continue current care. VAN TRIGT III,Silas Sedam 03/13/2012      LOS: 0 days    VAN TRIGT III,Katalena Malveaux 03/13/2012

## 2012-03-14 ENCOUNTER — Inpatient Hospital Stay (HOSPITAL_COMMUNITY): Payer: Medicare Other

## 2012-03-14 ENCOUNTER — Encounter (HOSPITAL_COMMUNITY): Payer: Self-pay | Admitting: Surgery

## 2012-03-14 LAB — CBC
Platelets: 173 10*3/uL (ref 150–400)
RBC: 4.09 MIL/uL (ref 3.87–5.11)
RDW: 12.4 % (ref 11.5–15.5)
WBC: 10 10*3/uL (ref 4.0–10.5)

## 2012-03-14 LAB — POCT I-STAT 3, ART BLOOD GAS (G3+)
Acid-Base Excess: 1 mmol/L (ref 0.0–2.0)
Patient temperature: 98

## 2012-03-14 LAB — BASIC METABOLIC PANEL
CO2: 22 mEq/L (ref 19–32)
Chloride: 110 mEq/L (ref 96–112)
GFR calc Af Amer: >90 mL/min (ref >90)
Potassium: 3.4 mEq/L — ABNORMAL LOW (ref 3.5–5.1)
Sodium: 139 mEq/L (ref 135–145)

## 2012-03-14 LAB — GLUCOSE, CAPILLARY
Glucose-Capillary: 108 mg/dL — ABNORMAL HIGH (ref 70–99)
Glucose-Capillary: 138 mg/dL — ABNORMAL HIGH (ref 70–99)

## 2012-03-14 MED ORDER — AMIODARONE HCL IN DEXTROSE 360-4.14 MG/200ML-% IV SOLN
INTRAVENOUS | Status: AC
  Administered 2012-03-14: 200 mL
  Filled 2012-03-14: qty 200

## 2012-03-14 MED ORDER — NALOXONE HCL 0.4 MG/ML IJ SOLN: 0.4000 mg | INTRAMUSCULAR | Status: DC | PRN

## 2012-03-14 MED ORDER — DIPHENHYDRAMINE HCL 50 MG/ML IJ SOLN: 12.5000 mg | Freq: Four times a day (QID) | INTRAMUSCULAR | Status: DC | PRN

## 2012-03-14 MED ORDER — AMIODARONE HCL IN DEXTROSE 360-4.14 MG/200ML-% IV SOLN
1.0000 mg/min | INTRAVENOUS | Status: AC
  Administered 2012-03-14: 1 mg/min via INTRAVENOUS
  Filled 2012-03-14: qty 200

## 2012-03-14 MED ORDER — PROMETHAZINE HCL 25 MG/ML IJ SOLN
12.5000 mg | Freq: Four times a day (QID) | INTRAMUSCULAR | Status: DC | PRN
  Filled 2012-03-14: qty 1

## 2012-03-14 MED ORDER — ENOXAPARIN SODIUM 40 MG/0.4ML ~~LOC~~ SOLN
40.0000 mg | SUBCUTANEOUS | Status: DC
  Administered 2012-03-14 – 2012-03-19 (×6): 40 mg via SUBCUTANEOUS
  Filled 2012-03-14 (×7): qty 0.4

## 2012-03-14 MED ORDER — POTASSIUM CHLORIDE 10 MEQ/50ML IV SOLN
10.0000 meq | INTRAVENOUS | Status: AC | PRN
  Administered 2012-03-14 (×3): 10 meq via INTRAVENOUS
  Filled 2012-03-14: qty 50

## 2012-03-14 MED ORDER — POTASSIUM CHLORIDE 10 MEQ/50ML IV SOLN
INTRAVENOUS | Status: AC
  Filled 2012-03-14: qty 50

## 2012-03-14 MED ORDER — ONDANSETRON HCL 4 MG/2ML IJ SOLN: 4.0000 mg | Freq: Four times a day (QID) | INTRAMUSCULAR | Status: DC | PRN

## 2012-03-14 MED ORDER — AMIODARONE HCL IN DEXTROSE 360-4.14 MG/200ML-% IV SOLN
0.5000 mg/min | INTRAVENOUS | Status: DC
  Administered 2012-03-14 – 2012-03-16 (×5): 0.5 mg/min via INTRAVENOUS
  Filled 2012-03-14 (×10): qty 200

## 2012-03-14 MED ORDER — AMIODARONE HCL IN DEXTROSE 360-4.14 MG/200ML-% IV SOLN
INTRAVENOUS | Status: AC
  Filled 2012-03-14: qty 200

## 2012-03-14 MED ORDER — FENTANYL 10 MCG/ML IV SOLN
INTRAVENOUS | Status: DC
  Administered 2012-03-14: 13:00:00 via INTRAVENOUS
  Administered 2012-03-14: 40 ug via INTRAVENOUS
  Administered 2012-03-15: 10 ug via INTRAVENOUS
  Administered 2012-03-15: 40 ug via INTRAVENOUS
  Administered 2012-03-15: 20 ug via INTRAVENOUS
  Administered 2012-03-15: 70 ug via INTRAVENOUS
  Administered 2012-03-15: 130 ug via INTRAVENOUS
  Administered 2012-03-15: 7 ug via INTRAVENOUS
  Administered 2012-03-16: 2 ug via INTRAVENOUS
  Filled 2012-03-14: qty 50

## 2012-03-14 MED ORDER — SODIUM CHLORIDE 0.9 % IJ SOLN: 9.0000 mL | INTRAMUSCULAR | Status: DC | PRN

## 2012-03-14 MED ORDER — DIPHENHYDRAMINE HCL 12.5 MG/5ML PO ELIX
12.5000 mg | ORAL_SOLUTION | Freq: Four times a day (QID) | ORAL | Status: DC | PRN
  Filled 2012-03-14: qty 5

## 2012-03-14 MED ORDER — KETOROLAC TROMETHAMINE 15 MG/ML IJ SOLN
15.0000 mg | Freq: Four times a day (QID) | INTRAMUSCULAR | Status: AC | PRN
  Administered 2012-03-16 – 2012-03-18 (×8): 15 mg via INTRAVENOUS
  Filled 2012-03-14 (×10): qty 1

## 2012-03-14 MED ORDER — AMIODARONE LOAD VIA INFUSION
150.0000 mg | Freq: Once | INTRAVENOUS | Status: DC
  Filled 2012-03-14: qty 83.34

## 2012-03-14 NOTE — Op Note (Signed)
NAMEVYOLET, ZEHRING NO.:  [id number]  MEDICAL RECORD NO.:  [id number]  LOCATION:  2310                         FACILITY:  [facility]  PHYSICIAN:  Evelene Croon, M.D.     DATE OF BIRTH:  11-26-1934  DATE OF PROCEDURE:  03/13/2012 DATE OF DISCHARGE:                              OPERATIVE REPORT   PREOPERATIVE DIAGNOSIS:  Right upper lobe lung mass.  POSTOPERATIVE DIAGNOSIS:  Right upper lobe lung mass.  OPERATIVE PROCEDURE:  Flexible fiberoptic bronchoscopy, right muscle- sparing thoracotomy with right upper lobectomy and mediastinal lymph node excision.  Insertion of On-Q pain pump.  ATTENDING SURGEON:  Evelene Croon, M.D.  ASSISTANT:  Pauline Good, PA-C.  ANESTHESIA:  General endotracheal.  CLINICAL HISTORY:  This patient is a 77 year old former heavy smoker. He reports noting some blood streak secretions in the back of the throat after coughing.  This started over the summer.  Chest x-ray showed a right upper lobe lung nodule.  CT scan of the chest confirmed a 14 x 9 mm nodule in the apex of the right upper lobe with some spiculation suspicious for bronchogenic carcinoma.  There is no mediastinal or hilar adenopathy.  There is some calcification of the thoracic aorta as well as of the great vessels, the mediastinum, and the coronary arteries.  A PET scan showed the right upper lobe lesion to be hypermetabolic with a maximum SUV of 3.4.  There were no other hypermetabolic areas seen on the scan.  After review of the scans and examination of the patient, it was felt that surgical resection of this lesion was indicated.  We obtained preoperative pulmonary function testing, which showed good pulmonary function and adequate reserve to tolerate right upper lobectomy.  We obtained an MRI of the brain, which was negative for metastatic disease.  We obtained a Cardiology consultation and a stress test was performed showing no signs of ischemia.  Given all  these findings, I felt that we should proceed ahead with surgical resection. I discussed the possibility of performing a wedge resection, and if this was cancer by frozen section then proceeding ahead with right upper lobectomy.  I also discussed the possibility that this may not be suitable for wedge resection due to its location and we may need to proceed ahead to right upper lobectomy.  I felt this was indicated since there was such a high likelihood that this was bronchogenic carcinoma. I discussed the operative procedure with the patient and her family including alternatives, benefits, and risks including, but not limited to, bleeding, blood transfusion, infection, bronchial stump complications, prolonged air leak, and recurrence of cancer.  She understood all this and agreed to proceed.  OPERATIVE PROCEDURE:  The patient was seen in the preoperative holding area and the proper patient, proper operation, proper operative side were confirmed with nursing and anesthesia staff as well as the patient after reviewing the CT scan.  The right-sided chest was signed by me. Consent was signed.  She was taken back to the operating room and placed on table in supine position.  Preoperative intravenous antibiotics were given.  After induction of general endotracheal anesthesia using a single-lumen tube, flexible fiberoptic bronchoscopy was  performed.  The distal trachea appeared normal.  The carina was sharp.  The right and left bronchial tree had normal segmental anatomy.  There were no endobronchial lesions and no secretions present.  The proximal was withdrawn from the patient.  Then, the single-lumen tube was converted to a double-lumen tube by Anesthesiology.  The patient was then turned into the left lateral decubitus position with the right side up.  The right side of the chest was then prepped with Betadine soap and solution, draped in usual sterile manner.  A time-out was taken.   Proper patient, proper operation, proper operative side were confirmed with nursing and anesthesia staff.  Then, the right chest was entered through a lateral muscle-sparing thoracotomy incision.  The pleural space was entered through the 4th intercostal space.  Chest retractor was placed. Exposure was good.  Examination of the right upper lobe showed an umbilical lesion present corresponding to the underlying lung mass.  I felt this was almost certainly lung cancer.  There was also some abnormality at the apex of the lung with discoloration that almost looked like a previous area of infarct.  Given the location of the lung lesion and the fact that it appeared to extend to the center of the lobe, I did not feel that this would be suitable for an initial wedge resection.  I was concerned that I will be cutting through an area of the mass.  Given the high likelihood of lung cancer, I felt it would be best to proceed ahead with right upper lobectomy and lymph node resection.  Then, the lung was retracted laterally and the mediastinal pleura opened over the hilum.  The phrenic nerve was identified and carefully avoided. The right upper lobe pulmonary artery was identified.  This was encircled with a tape and then divided using a vascular stapler. Dissection was continued down along the right pulmonary artery, and there was only 1 other branch seen which appeared to be the ascending branch.  Therefore the fissure was opened.  The interlobar pulmonary artery was easily identified in the fissure.  This was traced proximally and there was a single ascending posterior right upper lobe branch, which was encircled with a tape and divided using a vascular stapler. Then attention was turned to the right upper lobe pulmonary veins.  The right superior pulmonary vein had 3 branches.  The upper 2 branches appeared to go to the upper lobe and the lowermost branch to the middle lobe.  The middle lobe  branch was preserved.  The 2 upper lobe pulmonary vein branches were encircled with tapes and divided using a vascular stapler.  The fissure between the upper and middle lobe anteriorly was partially complete and then this was completed using a linear stapler. The fissure between the upper and lower lobe was complete already. Then, the lung was retracted anteriorly to expose the right upper lobe bronchus.  There were few mildly enlarged lymph nodes around the bronchus and these were taken with the specimen.  The right upper lobe bronchus was encircled with a TA 30 stapler with 4.8-mm staples.  The stapler was closed.  The right lung was reinflated and the middle and lower lobes inflated fully.  The stapler was fired and the right upper lobe bronchus divided distal to the stapler.  The right upper lobe specimen was then passed off the table and sent to Pathology for permanent section.  I did not feel there was any benefit doing a frozen section in this  patient since the lesion was peripheral and I am not going to change what we do in the operating room.  Then, the bronchial stump was tested at 30 cm of water pressure and there were no air leak seen.  There was a R10 and several R11 lymph nodes, which were removed and sent as separate specimens.  Then, the subcarinal space was explored.  There were no lymph nodes present there.  Then, the mediastinal pleura was opened at the level of the azygos vein and extended upwards.  The right peritracheal region and pretracheal region were explored and there were no visible lymph nodes at any of these peritracheal stations.  There was complete hemostasis.  Then, the staple line was lightly coated with FloSeal to seal any air leaks where the lung had been divided.  The inferior pulmonary ligament was divided to allow the lower lobe to rise.  The right lung was partially inflated and after measuring the appropriate position, the right middle lobe  was pexed to the lower lobe using three 3-0 Vicryl interrupted sutures. Then, an On Q catheter was brought out through a separate stab incision posterior axillary line and advanced in a subpleural location from below the level of the incision posterior to the incision and then 2 interspaces above.  It was primed with 5 mL of 0.5% Marcaine.  It was connected to a pain ball with 0.5% Marcaine.  Then, a 20-French chest tube was brought through a separate stab incision and positioned anteriorly up to the apex.  A 32-French Blake drain was brought out through a separate stab incision and positioned posteriorly in the chest.  Hemostasis was checked once again and was complete.  Then the ribs were reapproximated with #2 Vicryl pericostal sutures.  The muscles were returned to their normal anatomic position.  Subcutaneous tissue was closed with continuous 2-0 Vicryl and skin with a 3-0 Vicryl subcuticular closure.  The sponge, needle, and instrument counts were correct according to the scrub nurse.  Dry sterile dressings were applied over the incision and around the chest tubes, which were hooked to Pleur-Evac suction.  The patient was then turned in the supine position, extubated, and transported to the post anesthesia care unit in a satisfactory and stable condition.     Evelene Croon, M.D.     BB/MEDQ  D:  03/13/2012  T:  03/14/2012  Job:  [id number]

## 2012-03-14 NOTE — Progress Notes (Signed)
1 Day Post-Op Procedure(s) (LRB): VIDEO BRONCHOSCOPY (N/A) THORACOTOMY MAJOR (Right) LOBECTOMY (Right) LYMPH NODE BIOPSY (Right) Subjective:  No complaints  Objective: Vital signs in last 24 hours: Temp:  [96.3 F (35.7 C)-99.1 F (37.3 C)] 99.1 F (37.3 C) (10/18 0440) Pulse Rate:  [45-90] 76  (10/18 0900) Cardiac Rhythm:  [-] Normal sinus rhythm (10/18 0800) Resp:  [7-23] 23  (10/18 0900) BP: (82-125)/(47-77) 101/53 mmHg (10/18 0900) SpO2:  [92 %-100 %] 97 % (10/18 0900) Arterial Line BP: (58-133)/(42-68) 58/49 mmHg (10/18 0900) Weight:  [71 kg (156 lb 8.4 oz)] 71 kg (156 lb 8.4 oz) (10/18 0600)  Hemodynamic parameters for last 24 hours:    Intake/Output from previous day: 10/17 0701 - 10/18 0700 In: 3059.7 [P.O.:90; I.V.:2817.7; IV Piggyback:152] Out: 2234 [Urine:1700; Blood:100; Chest Tube:434] Intake/Output this shift: Total I/O In: -  Out: 40 [Urine:40]  General appearance: alert and cooperative Heart: regular rate and rhythm, S1, S2 normal, no murmur, click, rub or gallop Lungs: clear to auscultation bilaterally Extremities: extremities normal, atraumatic, no cyanosis or edema Wound: dressing dry Small intermittent air leak from chest tube  Lab Results:  Basename 03/14/12 0355 03/11/12 1359  WBC 10.0 7.2  HGB 13.2 15.2*  HCT 38.6 43.0  PLT 173 192   BMET:  Basename 03/14/12 0355 03/11/12 1359  NA 139 138  K 3.4* 4.1  CL 110 106  CO2 22 23  GLUCOSE 102* 96  BUN 6 11  CREATININE 0.57 0.63  CALCIUM 7.9* 9.0    PT/INR:  Basename 03/11/12 1359  LABPROT 13.6  INR 1.05   ABG    Component Value Date/Time   PHART 7.440 03/14/2012 0354   HCO3 25.6* 03/14/2012 0354   TCO2 27 03/14/2012 0354   ACIDBASEDEF 1.0 03/11/2012 1358   O2SAT 96.0 03/14/2012 0354   CBG (last 3)   Basename 03/14/12 0751 03/14/12 0438 03/13/12 2358  GLUCAP 109* 108* 131*   *RADIOLOGY REPORT*  Clinical Data: Chest tubes. Pneumothorax.  PORTABLE CHEST - 1 VIEW    Comparison: 03/13/2012  Findings: Bilateral right-sided chest tubes are unchanged with both  tips at the apex. No pneumothorax is seen.  The lungs are clear.  There are postsurgical changes superimposed over the right hilum,  stable. The right internal jugular central venous line has its  catheter tip in the lower superior vena cava. The right lateral  fifth rib fracture is stable. There is less overlying subcutaneous  air.  IMPRESSION:  Chest tube is stable. No pneumothorax. Subcutaneous air along the  right lateral chest wall has decreased. No other change from the  previous day's study.  Original Report Authenticated By: Domenic Moras, M.D.  Assessment/Plan: S/P Procedure(s) (LRB): VIDEO BRONCHOSCOPY (N/A) THORACOTOMY MAJOR (Right) LOBECTOMY (Right) LYMPH NODE BIOPSY (Right) Mobilize Continue foley due to patient in ICU and urinary output monitoring Incentive spirometry Follow up on pathology   LOS: 1 day    BARTLE,BRYAN K 03/14/2012

## 2012-03-14 NOTE — Progress Notes (Signed)
POD # 1 RUL  Resting comfortably Just went into a fib- amiodarone started  BP 102/63  Pulse 92  Temp 98.9 F (37.2 C) (Oral)  Resp 15  Wt 156 lb 8.4 oz (71 kg)  SpO2 96%   Intake/Output Summary (Last 24 hours) at 03/14/12 1729 Last data filed at 03/14/12 1500  Gross per 24 hour  Intake 1567.67 ml  Output   3055 ml  Net -1487.33 ml    Amiodarone for atrial fib

## 2012-03-14 NOTE — Progress Notes (Signed)
K 3.4, Cr 0.57, UOP>20cc/hr.  TCTS potassium protocol initiated.

## 2012-03-14 NOTE — Progress Notes (Signed)
  Amiodarone Drug - Drug Interaction Consult Note  Recommendations: Continue amiodarone and other medications as currently ordered.  At present there are no drug interactions with concurrent therapies.   Amiodarone is metabolized by the cytochrome P450 system and therefore has the potential to cause many drug interactions. Amiodarone has an average plasma half-life of 50 days (range 20 to 100 days).   There is potential for drug interactions to occur several weeks or months after stopping treatment and the onset of drug interactions may be slow after initiating amiodarone.   []  Statins: Increased risk of myopathy. Simvastatin- restrict dose to 20mg  daily. Other statins: counsel patients to report any muscle pain or weakness immediately.  []  Anticoagulants: Amiodarone can increase anticoagulant effect. Consider warfarin dose reduction. Patients should be monitored closely and the dose of anticoagulant altered accordingly, remembering that amiodarone levels take several weeks to stabilize.  []  Antiepileptics: Amiodarone can increase plasma concentration of phenytoin, phenytoin dose should be reduced. Note that small changes in phenytoin dose can result in large changes in phenytoin levels. Monitor patient closely and counsel on signs of toxicity.  []  Beta blockers: increased risk of bradycardia, AV block and myocardial depression. Sotalol - avoid concomitant use.  []   Calcium channel blockers (diltiazem and verapamil): increased risk of bradycardia, AV block and myocardial depression.  []   Cyclosporine: Amiodarone increases levels of cyclosporine. Reduced dose of cyclosporine is recommended.  []  Digoxin dose should be halved when amiodarone is started.  []  Diuretics: increased risk of cardiotoxicity if hypokalemia occurs.  []  Oral hypoglycemic agents (glyburide, glipizide, glimepiride): increased risk of hypoglycemia. Patient's glucose levels should be monitored closely when initiating  amiodarone therapy.   []  Drugs that prolong the QT interval: Concurrent therapy is contraindicated due to the increased risk of torsades de pointes; . Antibiotics: e.g. fluoroquinolones, erythromycin. . Antiarrhythmics: e.g. quinidine, procainamide, disopyramide, sotalol. . Antipsychotics: e.g. phenothiazines, haloperidol.  . Lithium, tricyclic antidepressants, and methadone.    Thank you for allowing pharmacy to be a part of this patients care team.  Lovenia Kim Pharm.D., BCPS Clinical Pharmacist 03/14/2012 7:02 PM Pager: [phone] Phone: [phone]

## 2012-03-15 ENCOUNTER — Inpatient Hospital Stay (HOSPITAL_COMMUNITY): Payer: Medicare Other

## 2012-03-15 LAB — GLUCOSE, CAPILLARY
Glucose-Capillary: 116 mg/dL — ABNORMAL HIGH (ref 70–99)
Glucose-Capillary: 130 mg/dL — ABNORMAL HIGH (ref 70–99)

## 2012-03-15 LAB — CBC
HCT: 40.4 % (ref 36.0–46.0)
Hemoglobin: 13.6 g/dL (ref 12.0–15.0)
MCH: 32.3 pg (ref 26.0–34.0)
MCHC: 33.7 g/dL (ref 30.0–36.0)

## 2012-03-15 LAB — COMPREHENSIVE METABOLIC PANEL
Alkaline Phosphatase: 51 U/L (ref 39–117)
BUN: 5 mg/dL — ABNORMAL LOW (ref 6–23)
Calcium: 8.3 mg/dL — ABNORMAL LOW (ref 8.4–10.5)
GFR calc Af Amer: >90 mL/min (ref >90)
GFR calc non Af Amer: 85 mL/min — ABNORMAL LOW (ref >90)
Glucose, Bld: 121 mg/dL — ABNORMAL HIGH (ref 70–99)
Total Protein: 5.5 g/dL — ABNORMAL LOW (ref 6.0–8.3)

## 2012-03-15 MED ORDER — FLUTICASONE PROPIONATE 50 MCG/ACT NA SUSP
1.0000 | Freq: Every day | NASAL | Status: DC | PRN
  Filled 2012-03-15: qty 16

## 2012-03-15 MED ORDER — ESTRADIOL 0.05 MG/24HR TD PTWK: 0.0500 mg | MEDICATED_PATCH | TRANSDERMAL | Status: DC

## 2012-03-15 MED ORDER — LORATADINE 10 MG PO TABS
10.0000 mg | ORAL_TABLET | Freq: Every day | ORAL | Status: DC | PRN
  Filled 2012-03-15: qty 1

## 2012-03-15 MED ORDER — LEVALBUTEROL HCL 0.63 MG/3ML IN NEBU
0.6300 mg | INHALATION_SOLUTION | Freq: Four times a day (QID) | RESPIRATORY_TRACT | Status: DC | PRN
  Filled 2012-03-15: qty 3

## 2012-03-15 NOTE — Progress Notes (Signed)
2 Days Post-Op Procedure(s) (LRB): VIDEO BRONCHOSCOPY (N/A) THORACOTOMY MAJOR (Right) LOBECTOMY (Right) LYMPH NODE BIOPSY (Right) Subjective: Feels a little better today N/V yesterday AM, better today, but still no appetite Pain controlled with PCA  Objective: Vital signs in last 24 hours: Temp:  [98.3 F (36.8 C)-99.6 F (37.6 C)] 98.9 F (37.2 C) (10/19 0755) Pulse Rate:  [68-97] 68  (10/19 0800) Cardiac Rhythm:  [-] Normal sinus rhythm (10/19 0800) Resp:  [11-24] 14  (10/19 0800) BP: (74-135)/(49-85) 102/85 mmHg (10/19 0800) SpO2:  [94 %-100 %] 99 % (10/19 0812) Arterial Line BP: (70)/(51) 70/51 mmHg (10/18 1000) Weight:  [156 lb 8.4 oz (71 kg)] 156 lb 8.4 oz (71 kg) (10/19 0600)  Hemodynamic parameters for last 24 hours:    Intake/Output from previous day: 10/18 0701 - 10/19 0700 In: 1957.1 [I.V.:1957.1] Out: 3360 [Urine:3000; Chest Tube:360] Intake/Output this shift: Total I/O In: 91.7 [I.V.:91.7] Out: 25 [Urine:25]  General appearance: alert and no distress Neurologic: intact Heart: regular rate and rhythm Lungs: diminished breath sounds bibasilar  Lab Results:  Basename 03/15/12 0400 03/14/12 0355  WBC 10.2 10.0  HGB 13.6 13.2  HCT 40.4 38.6  PLT 167 173   BMET:  Basename 03/15/12 0400 03/14/12 0355  NA 138 139  K 3.9 3.4*  CL 106 110  CO2 24 22  GLUCOSE 121* 102*  BUN 5* 6  CREATININE 0.65 0.57  CALCIUM 8.3* 7.9*    PT/INR: No results found for this basename: LABPROT,INR in the last 72 hours ABG    Component Value Date/Time   PHART 7.440 03/14/2012 0354   HCO3 25.6* 03/14/2012 0354   TCO2 27 03/14/2012 0354   ACIDBASEDEF 1.0 03/11/2012 1358   O2SAT 96.0 03/14/2012 0354   CBG (last 3)   Basename 03/15/12 0751 03/15/12 0353 03/14/12 2336  GLUCAP 133* 130* 116*    Assessment/Plan: S/P Procedure(s) (LRB): VIDEO BRONCHOSCOPY (N/A) THORACOTOMY MAJOR (Right) LOBECTOMY (Right) LYMPH NODE BIOPSY (Right) POD # 2 Right Upper  lobectomy CV- atrial fib yesterday, converted to SR with IV amiodarone, will continue IV another 24 hours and convert to PO tomorrow  RESP- COPD- continue bronchodilators,   No air leak- d/c Anterior CT  RENAL- lytes, creatinine OK  CBG OK- d/c CBG, SSI  Lovenox and SCD for DVT prophylaxis  Transfer to 2000   LOS: 2 days    HENDRICKSON,STEVEN C 03/15/2012

## 2012-03-16 ENCOUNTER — Inpatient Hospital Stay (HOSPITAL_COMMUNITY): Payer: Medicare Other

## 2012-03-16 NOTE — Progress Notes (Signed)
On Q pump removed per order.  Pt tolerated well.   Site dressed along with CT site dressing change.  Pump noted to be empty.  Will con't plan of care.

## 2012-03-16 NOTE — Progress Notes (Signed)
Patient wanted to stop using the PCA pump due to the nasal cannula that is required. She stated that it made her nose irritated and she hated the smell of the new plastic. Patient agreed to trying her prn IV toradol to see if it manages her pain. Will continue to monitor.

## 2012-03-16 NOTE — Progress Notes (Signed)
RN called radiology to follow up on portable chest xray ordered this morning for today that has not been done yet.  Await follow through.

## 2012-03-16 NOTE — Progress Notes (Signed)
Patient's Chest tube had 120cc of serosanguineous fluid overnight. Will continue to monitor.

## 2012-03-16 NOTE — Progress Notes (Signed)
50 ml emptied from fentanyl PCA syringe and tubing into sink, witnessed by Dereck Ligas, RN.  PCA DC'd per order.

## 2012-03-16 NOTE — Progress Notes (Addendum)
                   [address]            [place] [address]          [phone]    3 Days Post-Op Procedure(s) (LRB): VIDEO BRONCHOSCOPY (N/A) THORACOTOMY MAJOR (Right) LOBECTOMY (Right) LYMPH NODE BIOPSY (Right)  Subjective: Patient no longer with nausea or emesis.  Objective: Vital signs in last 24 hours: Patient Vitals for the past 24 hrs:  BP Temp Temp src Pulse Resp SpO2  03/16/12 0455 107/61 mmHg 98.5 F (36.9 C) Oral 63  17  97 %  03/16/12 0000 - - - - 16  95 %  03/15/12 2119 111/67 mmHg 99.3 F (37.4 C) Oral 85  18  96 %  03/15/12 1957 - - - - 19  95 %  03/15/12 1612 - - - - 16  96 %  03/15/12 1200 103/84 mmHg - - 98  20  97 %  03/15/12 1145 - 99.2 F (37.3 C) Oral - - -  03/15/12 1105 - - - 83  18  99 %  03/15/12 1100 119/76 mmHg - - 83  18  96 %  03/15/12 1000 123/67 mmHg - - 74  18  99 %      Intake/Output from previous day: 10/19 0701 - 10/20 0700 In: 408.5 [I.V.:408.5] Out: 1515 [Urine:1325; Chest Tube:190]   Physical Exam:  Cardiovascular: RRR. Pulmonary: Diminished on right;left lung is clear; no rales, wheezes, or rhonchi. Abdomen: Soft, non tender, bowel sounds present. Extremities: SCDs in place. Wounds: Clean and dry.   Chest Tube: +2-3 air leak with cough  Lab Results: CBC: Basename 03/15/12 0400 03/14/12 0355  WBC 10.2 10.0  HGB 13.6 13.2  HCT 40.4 38.6  PLT 167 173   BMET:  Basename 03/15/12 0400 03/14/12 0355  NA 138 139  K 3.9 3.4*  CL 106 110  CO2 24 22  GLUCOSE 121* 102*  BUN 5* 6  CREATININE 0.65 0.57  CALCIUM 8.3* 7.9*    PT/INR: No results found for this basename: LABPROT,INR in the last 72 hours ABG:  INR: Will add last result for INR, ABG once components are confirmed Will add last 4 CBG results once components are confirmed  Assessment/Plan:  1. CV - Paroxysmal Afib/Aflutter with HR in the 80's. On Amiodarone gttp. May change to oral soon. 2.  Pulmonary - Chest tube with 190 cc of output  last 24 hours. There is a 2-3+ air leak with cough. CXR not taken this am. Will order for tomorrow. 3.GI-No nausea or emesis this am. Will decrease IVF   ZIMMERMAN,DONIELLE MPA-C 03/16/2012,9:01 AM   Patient seen and examined. She needs a CXR today- it has been ordered She does have an air leak but it is small onQ is empty - will d/c She may need coumadin if a fib persists

## 2012-03-16 NOTE — Progress Notes (Signed)
Pt ambulating in hall with assistance from RN daughter.

## 2012-03-17 ENCOUNTER — Inpatient Hospital Stay (HOSPITAL_COMMUNITY): Payer: Medicare Other

## 2012-03-17 MED ORDER — AMIODARONE HCL 200 MG PO TABS
400.0000 mg | ORAL_TABLET | Freq: Two times a day (BID) | ORAL | Status: DC
  Administered 2012-03-17 – 2012-03-20 (×7): 400 mg via ORAL
  Filled 2012-03-17 (×8): qty 2

## 2012-03-17 MED ORDER — ESTRADIOL 0.05 MG/24HR TD PTWK
0.0500 mg | MEDICATED_PATCH | TRANSDERMAL | Status: DC
  Administered 2012-03-17: 0.05 mg via TRANSDERMAL
  Filled 2012-03-17: qty 1

## 2012-03-17 NOTE — Progress Notes (Signed)
Removed right IJ with catheter intact.  Pt tolerated the procedure well without any signs of distress.  Pt aware she is to remain on bedrest for 30 minutes.  Changed pleurevac per MD order without any issues.  Pt resting at this time with call bell within reach and family at bedside.  Will continue to monitor. Thomas Hoff

## 2012-03-17 NOTE — Progress Notes (Signed)
Chest tube output for period 7pm, 03/16/12 thru 03/17/12 ending 7am = 110 cc of serous sanguinous fluid.

## 2012-03-17 NOTE — Progress Notes (Signed)
4 Days Post-Op Procedure(s) (LRB): VIDEO BRONCHOSCOPY (N/A) THORACOTOMY MAJOR (Right) LOBECTOMY (Right) LYMPH NODE BIOPSY (Right) Subjective: No complaints   Objective: Vital signs in last 24 hours: Temp:  [98.2 F (36.8 C)-98.8 F (37.1 C)] 98.4 F (36.9 C) (10/21 0447) Pulse Rate:  [64-78] 64  (10/21 0447) Cardiac Rhythm:  [-] Normal sinus rhythm (10/20 2000) Resp:  [18-19] 19  (10/21 0447) BP: (107-138)/(55-64) 110/57 mmHg (10/21 0447) SpO2:  [97 %-100 %] 98 % (10/21 0447)  Hemodynamic parameters for last 24 hours:    Intake/Output from previous day: 10/20 0701 - 10/21 0700 In: 720 [P.O.:720] Out: 1310 [Urine:800; Chest Tube:510] Intake/Output this shift:    General appearance: alert and cooperative Heart: irregularly irregular rhythm Lungs: clear to auscultation bilaterally Extremities: extremities normal, atraumatic, no cyanosis or edema Wound: incision ok  Lab Results:  Basename 03/15/12 0400  WBC 10.2  HGB 13.6  HCT 40.4  PLT 167   BMET:  Basename 03/15/12 0400  NA 138  K 3.9  CL 106  CO2 24  GLUCOSE 121*  BUN 5*  CREATININE 0.65  CALCIUM 8.3*    PT/INR: No results found for this basename: LABPROT,INR in the last 72 hours ABG    Component Value Date/Time   PHART 7.440 03/14/2012 0354   HCO3 25.6* 03/14/2012 0354   TCO2 27 03/14/2012 0354   ACIDBASEDEF 1.0 03/11/2012 1358   O2SAT 96.0 03/14/2012 0354   CBG (last 3)   Basename 03/15/12 1141 03/15/12 0751 03/15/12 0353  GLUCAP 169* 133* 130*    Assessment/Plan: S/P Procedure(s) (LRB): VIDEO BRONCHOSCOPY (N/A) THORACOTOMY MAJOR (Right) LOBECTOMY (Right) LYMPH NODE BIOPSY (Right) Postop A-fib/flutter with controlled rate: She is still having some episodes of this that are rate-controlled on IV amio. Will switch to oral amiodarone and observe. She may need coumadin.  CXR shows small amount of subcutaneous air after removal of anterior chest tube but it looks stable. The posterior tube is  still putting out significant volume ( 500 cc for 24 hrs) so will leave the tube in today.  Pathology pending  Mobilize   LOS: 4 days    BARTLE,BRYAN K 03/17/2012

## 2012-03-18 ENCOUNTER — Inpatient Hospital Stay (HOSPITAL_COMMUNITY): Payer: Medicare Other

## 2012-03-18 ENCOUNTER — Encounter (HOSPITAL_COMMUNITY): Payer: Self-pay | Admitting: General Practice

## 2012-03-18 DIAGNOSIS — Z902 Acquired absence of lung [part of]: Secondary | ICD-10-CM

## 2012-03-18 MED ORDER — TRAMADOL HCL 50 MG PO TABS: 50.0000 mg | ORAL_TABLET | Freq: Four times a day (QID) | ORAL | Status: DC | PRN

## 2012-03-18 MED ORDER — AMIODARONE HCL 400 MG PO TABS: 400.0000 mg | ORAL_TABLET | Freq: Two times a day (BID) | ORAL | Status: DC

## 2012-03-18 MED ORDER — GUAIFENESIN 100 MG/5ML PO SYRP
100.0000 mg | ORAL_SOLUTION | ORAL | Status: DC | PRN
  Administered 2012-03-18: 100 mg via ORAL
  Filled 2012-03-18 (×2): qty 118

## 2012-03-18 NOTE — Care Management Note (Signed)
    Page 1 of 2   03/19/2012     4:12:31 PM   CARE MANAGEMENT NOTE 03/19/2012  Patient:  [name]   Account Number:  [id number]  Date Initiated:  03/14/2012  Documentation initiated by:  [name]  Subjective/Objective Assessment:   Post op thoracotomy on 03-13-12  Has [name] and [name].     Action/Plan:   MET WITH PT AND FAMILY TO DISCUSS DISCHARGE NEEDS.  PT REQUESTS HHRN FOR BLOOD DRAWS AT HOME.  WILL FOLLOW UP WITH MD/PA FOR ORDER.   Anticipated DC Date:  03/19/2012   Anticipated DC Plan:  HOME W HOME HEALTH SERVICES      DC Planning Services  CM consult      [facility] Choice  HOME HEALTH   Choice offered to / List presented to:  C-1 Patient        HH arranged  HH-1 RN  HH-4 NURSE'S AIDE      HH agency  [facility]   Status of service:  Completed, signed off Medicare Important Message given?   (If response is "NO", the following Medicare IM given date fields will be blank) Date Medicare IM given:   Date Additional Medicare IM given:    Discharge Disposition:  HOME W HOME HEALTH SERVICES  Per UR Regulation:  Reviewed for med. necessity/level of care/duration of stay  If discussed at Long Length of Stay Meetings, dates discussed:    Comments:  Contact:  Feasel,[name] [phone]                  Boodram,[name]     [phone]  03/19/12 Angelica Maxim,RN,BSN [phone] REFERRAL TO [facility] FOR HOME HEALTH NEEDS.  START OF CARE 24-48H POST DC DATE.  03/18/12 Angelica Brazeau,RN,BSN [phone] PT REQUESTS HHRN AT DC TO DO PT/INR DRAWS, AS SHE THINKS SHE WILL BE DC'D ON COUMADIN.  WILL FOLLOW UP WITH MD/PA FOR ORDERS/FACE TO FACE DOCUMENTATION.  PT WISHES TO USE [facility] FOR HH NEEDS.  03-14-12 3:20pm [name], RNBSN [phone] Post op day one - supportive family.  Doing well.  CM will continue to follow.

## 2012-03-18 NOTE — Progress Notes (Signed)
Pt chest tube removed with the assistance of another staff RN. Pt tolerated well. Occlusive vaseline gauze dressing applied. Follow up chest x-ray ordered, awaiting results.

## 2012-03-18 NOTE — Progress Notes (Addendum)
5 Days Post-Op Procedure(s) (LRB): VIDEO BRONCHOSCOPY (N/A) THORACOTOMY MAJOR (Right) LOBECTOMY (Right) LYMPH NODE BIOPSY (Right) Subjective:  [name] complains of not sleeping last night.  She states she was up all night coughing.  +Ambulation, +BM  Objective: Vital signs in last 24 hours: Temp:  [98.1 F (36.7 C)-98.4 F (36.9 C)] 98.2 F (36.8 C) (10/22 0415) Pulse Rate:  [60-66] 60  (10/22 0415) Cardiac Rhythm:  [-] Normal sinus rhythm (10/21 1940) Resp:  [18] 18  (10/22 0415) BP: (130-137)/(66-78) 130/78 mmHg (10/22 0415) SpO2:  [97 %-99 %] 97 % (10/22 0415) Weight:  [159 lb 8 oz (72.349 kg)] 159 lb 8 oz (72.349 kg) (10/22 0236)    Intake/Output from previous day: 10/21 0701 - 10/22 0700 In: 720 [P.O.:720] Out: 510 [Urine:200; Chest Tube:310]  General appearance: alert, cooperative and no distress Heart: regular rate and rhythm Lungs: clear to auscultation bilaterally Abdomen: soft, non-tender; bowel sounds normal; no masses,  no organomegaly Extremities: extremities normal, atraumatic, no cyanosis or edema Wound: clean and dry  Lab Results: No results found for this basename: WBC:2,HGB:2,HCT:2,PLT:2 in the last 72 hours BMET: No results found for this basename: NA:2,K:2,CL:2,CO2:2,GLUCOSE:2,BUN:2,CREATININE:2,CALCIUM:2 in the last 72 hours  PT/INR: No results found for this basename: LABPROT,INR in the last 72 hours ABG    Component Value Date/Time   PHART 7.440 03/14/2012 0354   HCO3 25.6* 03/14/2012 0354   TCO2 27 03/14/2012 0354   ACIDBASEDEF 1.0 03/11/2012 1358   O2SAT 96.0 03/14/2012 0354   CBG (last 3)   Basename 03/15/12 1141  GLUCAP 169*    Assessment/Plan: S/P Procedure(s) (LRB): VIDEO BRONCHOSCOPY (N/A) THORACOTOMY MAJOR (Right) LOBECTOMY (Right) LYMPH NODE BIOPSY (Right)  1. CV- Previous A. Fib- currently NSR on oral Amiodarone 2. Pulm- chest tube with some tidaling present, no definitive air leak present, 140cc output last 24 hours    3. Cough- Robitussin nightly prn 4. Dispo- can d/c last chest tube this morning, repeat CXR in AM pathology pending   LOS: 5 days    Lowella Dandy 03/18/2012    Chart reviewed, patient examined, agree with above. CXR looks better with subcutaneous air resolving. There is no air leak from chest tube and low output. Will DC tube.  Pathologist called me yesterday and said they were not sure what the lung lesion was so they were sending it out for another opinion. Discussed this with family and patient.

## 2012-03-18 NOTE — Discharge Summary (Signed)
Physician Discharge Summary  Patient ID: [name] MRN: [id number] DOB/AGE: 75-09-1934 75 y.o.  Admit date: 03/13/2012 Discharge date: 03/18/2012  Admission Diagnoses:  Patient Active Problem List  Diagnosis  . Hypothyroidism  . Plantar fasciitis  . GERD (gastroesophageal reflux disease)  . IBS (irritable colon syndrome)  . Pulmonary nodule  . Coronary artery calcification seen on CAT scan  . COPD (chronic obstructive pulmonary disease)   Discharge Diagnoses:   Patient Active Problem List  Diagnosis  . Hypothyroidism  . Plantar fasciitis  . GERD (gastroesophageal reflux disease)  . IBS (irritable colon syndrome)  . Pulmonary nodule  . Coronary artery calcification seen on CAT scan  . COPD (chronic obstructive pulmonary disease)  . S/P lobectomy of lung   Discharged Condition: good  History of Present Illness:   Mr. [name] is a 77 yo white female with history of heavy tobacco use who presented to her Pulmonologist with a complaint of some blood tinged sputum after coughing.  The patient stated this had started over the summer.  She underwent CXR which showed a right upper lobe lung nodule.  CT scan obtained showed a 14 x 9 mm nodule in the apex of the right upper lobe with some spiculation suspicious for bronchogenic carcinoma.  There was no evidence of mediastinal or hilar adenopathy.  PET CT scan was obtained and showed the lesion to be hypermetabolic with maxium SUV of 3.4.  Due to these findings the patient was referred to Dr. Laneta Simmers for surgical evaluation.  She was evaluated by Dr. Laneta Simmers on 02/21/2012 at which time he felt she would require further preoperative workup before proceeding with surgery.  The patient underwent MRI of the brain which did not show evidence of metastasis.  Her PFTs were acceptable to proceed with Lobectomy.  She also underwent stress testing and was cleared by Cardiology.  The risks and benefits of the procedure were explained to the patient and  she was agreeable to proceed.  Surgery was scheduled for 03/13/2012.    Hospital Course:   [name] presented to [facility] on 03/13/2012.  She was taken to the operating room and underwent Right Thoracotomy with Right Upper Lobectomy and mediastinal lymph node dissection.  The patient tolerated the procedure well, was extubated and taken to the PACU in stable condition.  POD #1 patient developed Atrial Fibrillation.  She was placed on Amiodarone drip.  POD #2 patient maintaining NSR, she remained on IV Amiodarone.  Her chest tube did not have any evidence of air leak.  Her anterior chest tube was removed without difficulty.  POD #3 the patients chest tube had an air leak.  Her On-Q pain catheter was removed.  POD #4 remaining chest tube with increased drainage.  CXR shows stable appearance of subcutaneous air present.  No evidence of pneumothorax.  POD #5 the patients final chest tube was removed without difficulty.  She is maintaining NSR on oral Amiodarone.  She is medically stable at this time.  We will get a chest xray in the morning and if there is no evidence of acute pneumothorax we will plan to discharge her home.  Final pathology is pending, specimen was sent out for second opinion.  She will follow up with Dr. Laneta Simmers on 04/01/2012.  She will also need to follow up with Dr. Craige Cotta       Treatments: surgery:   Flexible fiberoptic bronchoscopy, right muscle-  sparing thoracotomy with right upper lobectomy and mediastinal lymph  node  excision. Insertion of On-Q pain pump.   Disposition: 01-Home or Self Care  Discharge Orders    Future Appointments: Provider: Department: Dept Phone: Center:   04/01/2012 2:30 PM [name], MD [facility] [phone] [facility]   04/16/2012 1:45 PM Coralyn Helling, MD [facility] [phone] None       Medication List     As of 03/18/2012 10:36 AM    TAKE these medications         amiodarone 400 MG tablet   Commonly known as:  PACERONE   Take 1 tablet (400 mg total) by mouth 2 (two) times daily.      CELEBREX 200 MG capsule   Generic drug: celecoxib   Take 200 mg by mouth as needed. For pain      esomeprazole 40 MG capsule   Commonly known as: NEXIUM   Take 1 capsule (40 mg total) by mouth daily.      estradiol 0.05 MG/24HR   Commonly known as: VIVELLE-DOT   Place 1 patch onto the skin 2 (two) times a week. Sunday & Thursday      ESTRADIOL VA   Place 1 applicator vaginally. Twice a week      FIBER PO   Take 2 Units by mouth daily. 2 units=2 teaspoonfuls      levothyroxine 75 MCG tablet   Commonly known as: SYNTHROID, LEVOTHROID   Take 75 mcg by mouth daily before breakfast.      loratadine 10 MG tablet   Commonly known as: CLARITIN   Take 10 mg by mouth daily as needed.      mometasone 50 MCG/ACT nasal spray   Commonly known as: NASONEX   Place 2 sprays into the nose as needed. For allergies      multivitamin capsule   Take 1 capsule by mouth daily.      Tart Cherry Advanced Caps   Take 1 capsule by mouth 2 (two) times daily. Patient takes this Black Cherry 250 mg Capsule twice daily      traMADol 50 MG tablet   Commonly known as: ULTRAM   Take 1-2 tablets (50-100 mg total) by mouth every 6 (six) hours as needed.      TRUBIOTICS PO   Take 1 capsule by mouth daily.           Follow-up Information    Follow up with [name], MD. On 04/01/2012. (Appointment is at 230pm)    Contact information:   [address] [employer] [address] [place] [address] [phone]       Follow up with [facility]. On 04/01/2012. (Please get chest xray 1 hour prior to your appointment with Dr. Laneta Simmers)    Contact information:   [address] [place] [address]       Follow up with SOOD,VINEET, MD. In 2 weeks. (Contact office to set up appointment)    Contact information:   [address] [facility] [place] [address] [phone]       Follow up with  Carylon Perches, MD. In 2 weeks. (Contact office to set up appointment for new onset Atrial Fibrillation)    Contact information:   [address] [place] [address] [phone]          Signed: Lowella Dandy 03/18/2012, 10:36 AM

## 2012-03-19 ENCOUNTER — Inpatient Hospital Stay (HOSPITAL_COMMUNITY): Payer: Medicare Other

## 2012-03-19 MED ORDER — GUAIFENESIN 100 MG/5ML PO SOLN
5.0000 mL | ORAL | Status: DC | PRN
  Administered 2012-03-19: 100 mg via ORAL
  Filled 2012-03-19 (×2): qty 5

## 2012-03-19 MED ORDER — ACETAMINOPHEN 500 MG PO TABS
1000.0000 mg | ORAL_TABLET | Freq: Four times a day (QID) | ORAL | Status: DC | PRN
  Administered 2012-03-19 – 2012-03-20 (×4): 1000 mg via ORAL
  Filled 2012-03-19 (×4): qty 2

## 2012-03-19 NOTE — Progress Notes (Addendum)
6 Days Post-Op Procedure(s) (LRB): VIDEO BRONCHOSCOPY (N/A) THORACOTOMY MAJOR (Right) LOBECTOMY (Right) LYMPH NODE BIOPSY (Right) Subjective:  Ms. Gasser is doing much better this morning.  She states she slept great last night.  She has some minor discomfort for which she requests Tylenol.  Objective: Vital signs in last 24 hours: Temp:  [97.4 F (36.3 C)-98.5 F (36.9 C)] 98.2 F (36.8 C) (10/23 0500) Pulse Rate:  [60-66] 60  (10/23 0500) Cardiac Rhythm:  [-] Heart block (10/22 1935) Resp:  [14-18] 18  (10/23 0500) BP: (115-136)/(59-83) 119/62 mmHg (10/23 0500) SpO2:  [95 %-100 %] 97 % (10/23 0500)  Hemodynamic parameters for last 24 hours:    Intake/Output from previous day: 10/22 0701 - 10/23 0700 In: 480 [P.O.:480] Out: 400 [Urine:400] Intake/Output this shift:    General appearance: alert, cooperative and no distress Heart: regular rate and rhythm Lungs: clear to auscultation bilaterally Abdomen: soft, non-tender; bowel sounds normal; no masses,  no organomegaly Wound: clean and dry  Lab Results: No results found for this basename: WBC:2,HGB:2,HCT:2,PLT:2 in the last 72 hours BMET: No results found for this basename: NA:2,K:2,CL:2,CO2:2,GLUCOSE:2,BUN:2,CREATININE:2,CALCIUM:2 in the last 72 hours  PT/INR: No results found for this basename: LABPROT,INR in the last 72 hours ABG    Component Value Date/Time   PHART 7.440 03/14/2012 0354   HCO3 25.6* 03/14/2012 0354   TCO2 27 03/14/2012 0354   ACIDBASEDEF 1.0 03/11/2012 1358   O2SAT 96.0 03/14/2012 0354   CBG (last 3)  No results found for this basename: GLUCAP:3 in the last 72 hours  Assessment/Plan: S/P Procedure(s) (LRB): VIDEO BRONCHOSCOPY (N/A) THORACOTOMY MAJOR (Right) LOBECTOMY (Right) LYMPH NODE BIOPSY (Right)  1. CV- NSR, previous A. FIb- continue Amiodarone 2. Pulm- small pneumothorax S/P Chest tube removal, stable 3. Pain Control- Tylenol prn 4. Dispo- [atient doing well, will plan for d/c in  the AM with home health   LOS: 6 days    Lowella Dandy 03/19/2012    Chart reviewed, patient examined, agree with above. She is continuing to progress. CXR looks stable Continue ambulation and IS today and plan to send home tomorrow if nothing changes.

## 2012-03-20 MED ORDER — IBUPROFEN 800 MG PO TABS: 800.0000 mg | ORAL_TABLET | Freq: Three times a day (TID) | ORAL | Status: DC | PRN

## 2012-03-20 MED ORDER — ACETAMINOPHEN 500 MG PO TABS: 1000.0000 mg | ORAL_TABLET | Freq: Four times a day (QID) | ORAL | Status: DC | PRN

## 2012-03-20 MED ORDER — GUAIFENESIN 100 MG/5ML PO SOLN: 5.0000 mL | ORAL | Status: DC | PRN

## 2012-03-20 NOTE — Progress Notes (Addendum)
7 Days Post-Op Procedure(s) (LRB): VIDEO BRONCHOSCOPY (N/A) THORACOTOMY MAJOR (Right) LOBECTOMY (Right) LYMPH NODE BIOPSY (Right) Subjective:  [name] has no complaints this morning.  She states her pain is better this morning with use of Tylenol. +BM  Objective: Vital signs in last 24 hours: Temp:  [97.5 F (36.4 C)-97.8 F (36.6 C)] 97.5 F (36.4 C) (10/24 0526) Pulse Rate:  [58-66] 58  (10/24 0526) Cardiac Rhythm:  [-] Normal sinus rhythm (10/23 1948) Resp:  [18] 18  (10/24 0526) BP: (120-144)/(61-70) 133/68 mmHg (10/24 0526) SpO2:  [98 %-100 %] 98 % (10/24 0526)  Intake/Output from previous day: 10/23 0701 - 10/24 0700 In: 1560 [P.O.:1560] Out: 1800 [Urine:1800]  General appearance: alert, cooperative and no distress Neurologic: intact Heart: regular rate and rhythm Lungs: clear to auscultation bilaterally Abdomen: soft, non-tender; bowel sounds normal; no masses,  no organomegaly Wound: clean and dry  Lab Results: No results found for this basename: WBC:2,HGB:2,HCT:2,PLT:2 in the last 72 hours BMET: No results found for this basename: NA:2,K:2,CL:2,CO2:2,GLUCOSE:2,BUN:2,CREATININE:2,CALCIUM:2 in the last 72 hours  PT/INR: No results found for this basename: LABPROT,INR in the last 72 hours ABG    Component Value Date/Time   PHART 7.440 03/14/2012 0354   HCO3 25.6* 03/14/2012 0354   TCO2 27 03/14/2012 0354   ACIDBASEDEF 1.0 03/11/2012 1358   O2SAT 96.0 03/14/2012 0354   CBG (last 3)  No results found for this basename: GLUCAP:3 in the last 72 hours  Assessment/Plan: S/P Procedure(s) (LRB): VIDEO BRONCHOSCOPY (N/A) THORACOTOMY MAJOR (Right) LOBECTOMY (Right) LYMPH NODE BIOPSY (Right)  1. CV- Previous Atrial Fibrillation, maintaining NSR on Amiodarone 2. Pulm- no acute issues, continue IS 3. Dispo- patient doing well, will d/c home today    LOS: 7 days    [name], Angelica 03/20/2012    Chart reviewed, patient examined, agree with above. She looks  and feels well. Lungs are clear. Plan to send home today. Pathology is still pending and I will call her when it it is back. This may take 1-2 weeks.

## 2012-03-28 ENCOUNTER — Other Ambulatory Visit: Payer: Self-pay | Admitting: Surgery

## 2012-03-28 DIAGNOSIS — Z902 Acquired absence of lung [part of]: Secondary | ICD-10-CM

## 2012-04-01 ENCOUNTER — Ambulatory Visit
Admission: RE | Admit: 2012-04-01 | Discharge: 2012-04-01 | Disposition: A | Discharge status: Home / Self Care | Payer: Medicare Other | Source: Ambulatory Visit | Attending: Surgery | Admitting: Surgery

## 2012-04-01 ENCOUNTER — Encounter: Payer: Self-pay | Admitting: Surgery

## 2012-04-01 ENCOUNTER — Ambulatory Visit (INDEPENDENT_AMBULATORY_CARE_PROVIDER_SITE_OTHER): Payer: Self-pay | Admitting: Surgery

## 2012-04-01 VITALS — BP 130/89 | HR 123 | Resp 16 | Ht 61.5 in | Wt 155.4 lb

## 2012-04-01 DIAGNOSIS — R918 Other nonspecific abnormal finding of lung field: Secondary | ICD-10-CM

## 2012-04-01 DIAGNOSIS — R222 Localized swelling, mass and lump, trunk: Secondary | ICD-10-CM

## 2012-04-01 DIAGNOSIS — Z902 Acquired absence of lung [part of]: Secondary | ICD-10-CM

## 2012-04-01 NOTE — Progress Notes (Signed)
[address]            [place] [address]          [phone]       HPI:  The patient returns today for followup status post right thoracotomy and right upper lobectomy on 03/13/2012 for removal of a 14 x 9 mm spiculated nodule in the right upper lobe that was hypermetabolic on PET scan. This lesion was too deep in the lung to perform wedge resection. The pathologist was unsure of the diagnosis and therefore it was sent to [facility] for a pathology consultation with Dr. Steffanie Dunn. 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 Since surgery the patient said that she is making slow progress. She still has some right chest wall pain. She is walking daily without difficulty. She was seen earlier today in Dr. Anne Fu' office for followup of postoperative atrial fibrillation treated with amiodarone and she was found to be tachycardic in the 120s with what appeared to be SVT with retrograde P waves on ECG. She was continued on amiodarone and Lopressor was started. He is planning on seeing her back in one week for followup.  Current Outpatient Prescriptions  Medication Sig Dispense Refill  . acetaminophen (TYLENOL) 500 MG tablet Take 325 mg by mouth every 6 (six) hours as needed.      [name] amiodarone (PACERONE) 400 MG tablet Take 200 mg by mouth 2 (two) times daily.      . CELEBREX 200 MG capsule Take 200 mg by mouth as needed. For pain      . esomeprazole (NEXIUM) 40 MG capsule Take 1 capsule (40 mg total) by mouth daily.  30 capsule  11  . estradiol (VIVELLE-DOT) 0.05 MG/24HR Place 1 patch onto the skin 2 (two) times a week. Sunday & Thursday      . ESTRADIOL VA Place 1 applicator vaginally. Twice a week      . FIBER PO Take 2 Units by mouth daily. 2 units=2 teaspoonfuls      . ibuprofen (ADVIL,MOTRIN) 800 MG tablet Take 1 tablet (800 mg total) by mouth every 8 (eight)  hours as needed for pain. For First 72 hours take 800mg  every 8 hours, then use every 8 hours prn pain  30 tablet  0  . levothyroxine (SYNTHROID, LEVOTHROID) 75 MCG tablet Take 75 mcg by mouth daily before breakfast.       . loratadine (CLARITIN) 10 MG tablet Take 10 mg by mouth daily as needed.       . metoprolol tartrate (LOPRESSOR) 25 MG tablet Take 25 mg by mouth 2 (two) times daily.      . Misc Natural Products (TART CHERRY ADVANCED) CAPS Take 1 capsule by mouth 2 (two) times daily. Patient takes this Black Cherry 250 mg Capsule twice daily      . mometasone (NASONEX) 50 MCG/ACT nasal spray Place 2 sprays into the nose as needed. For allergies      . Multiple Vitamin (MULTIVITAMIN) capsule Take 1 capsule by mouth daily.       . Probiotic Product (TRUBIOTICS PO) Take 1 capsule by mouth daily.       . [DISCONTINUED] amiodarone (PACERONE) 400 MG tablet Take 1 tablet (400 mg total) by mouth 2 (two) times daily.  60 tablet  1  Physical Exam: BP 130/89  Pulse 123  Resp 16  Ht 5' 1.5" (1.562 m)  Wt 155 lb 6.4 oz (70.489 kg)  BMI 28.89 kg/m2  SpO2 96% She looks tired but in no distress. Lung exam is clear. Cardiac exam shows a regular rate and rhythm with normal heart sounds. The right chest incisions are healing well.  Diagnostic Tests:  *RADIOLOGY REPORT*   Clinical Data: Status post right upper lobectomy, weakness   CHEST - 2 VIEW   Comparison: 03/19/2012   Findings: Postsurgical changes in the right hemithorax with volume loss and tenting of the right hemidiaphragm.   No pneumothorax is seen.  Prior subcutaneous emphysema has resolved.   Lungs are otherwise clear.  No pleural effusion.   Cardiomediastinal silhouette is within normal limits.   Right lateral fifth rib fracture deformity.   Surgical clips in the right chest wall/breast.   IMPRESSION: Postsurgical changes in the right hemithorax.  No pneumothorax is seen.     Original Report Authenticated By:  Charline Bills, M.D.     Impression:  Overall I think she is making a satisfactory recovery following right upper lobectomy. Unfortunately right upper lobectomy was necessary for removal of this lesion that was highly suspicious for bronchogenic carcinoma. I discussed the pathology results with her and her family. She is relieved that there is no sign of lung cancer. I expect that she will continue to improve over time.  Plan:  She will followup with Dr. Anne Fu next week concerning her atrial arrhythmias and I will plan to see her back in one month with a repeat chest x-ray.

## 2012-04-16 ENCOUNTER — Ambulatory Visit (INDEPENDENT_AMBULATORY_CARE_PROVIDER_SITE_OTHER): Payer: Medicare Other | Admitting: Pulmonary Disease

## 2012-04-16 ENCOUNTER — Encounter: Payer: Self-pay | Admitting: Pulmonary Disease

## 2012-04-16 VITALS — BP 96/60 | HR 70 | Temp 98.1°F | Ht 61.5 in | Wt 157.0 lb

## 2012-04-16 DIAGNOSIS — J449 Chronic obstructive pulmonary disease, unspecified: Secondary | ICD-10-CM

## 2012-04-16 DIAGNOSIS — R911 Solitary pulmonary nodule: Secondary | ICD-10-CM

## 2012-04-16 NOTE — Assessment & Plan Note (Signed)
Will plan to repeat PFT in 6 months.

## 2012-04-16 NOTE — Assessment & Plan Note (Signed)
She is recovering well after VATS.

## 2012-04-16 NOTE — Patient Instructions (Signed)
Will schedule breathing test (PFT) for May 2014 Follow up after PFT in May 2014

## 2012-04-16 NOTE — Progress Notes (Signed)
Chief Complaint  Patient presents with  . Follow-up    pt had surgery-RUL removed 03/13/12-[facility]. feeling okay.     CC: Donato Schultz  History of Present Illness: [name] is a 77 y.o. female former smoker with mild COPD and pulmonary nodule.  She also has chronic cough from reflux.  She is here after having VATs.  Fortunately the pulmonary nodule was benign.  She remains on amiodarone for post-op A fib.  She is doing better with her activity.  She has occasional cough.  She denies wheeze or sputum production.  Tests: CT chest 01/03/12>>14x9 mm RUL nodule PFT 02/08/12>>FEV1 1.63 (95%), FEV1% 65, TLC 4.12 (94%), DLCO 74%, no BD PET scan 02/07/12>>13 mm RUL nodule with 3.4 SUV Rt VATS 03/13/12>>benign lesion  Past Medical History  Diagnosis Date  . Urinary incontinence     "slight bit" (03/18/2012)  . Fibroid   . GERD (gastroesophageal reflux disease)   . Lesion of right lung   . Hypercholesteremia   . Hypothyroidism   . Post menopausal syndrome   . Burning mouth syndrome   . Plantar fascial fibromatosis   . COPD (chronic obstructive pulmonary disease) 02/12/2012    PFT 02/08/12>>FEV1 1.63 (95%), FEV1% 65, TLC 4.12 (94%), DLCO 74%, no BD   . H/O hiatal hernia   . Arthritis     hands   . Lung mass     R upper lobe   . Atrial fibrillation and flutter   . Heart murmur     02/2012 treadmill & echo, told that she has a "leak", but cleared for surgery    . History of chronic bronchitis     "controlled after GERD controlled" (03/18/2012)  . Bloody sputum     "started 12/2011" (03/18/2012)  . Ocular migraine     "I have the flashing lights" (03/18/2012)    Past Surgical History  Procedure Date  . Bladder suspension 2007    Anterior repair  . Colonoscopy   . Upper gastrointestinal endoscopy   . Eye surgery     implants in both eyes   . Video bronchoscopy 03/13/2012    Procedure: VIDEO BRONCHOSCOPY;  Surgeon: [name], MD;  Location: [facility] OR;  Service: Thoracic;   Laterality: N/A;  . Thoracotomy 03/13/2012    Procedure: THORACOTOMY MAJOR;  Surgeon: [name], MD;  Location: [facility] OR;  Service: Thoracic;  Laterality: Right;  Right Thoracotomy, Right Upper Lobectomy, and lymphnode sampling  . Lobectomy 03/13/2012    Procedure: LOBECTOMY;  Surgeon: [name], MD;  Location: [facility] OR;  Service: Thoracic;  Laterality: Right;  Right Thoracotomy, Right Upper Lobectomy, and lymphnode sampling  . Lymph node biopsy 03/13/2012    Procedure: LYMPH NODE BIOPSY;  Surgeon: [name], MD;  Location: [facility] OR;  Service: Thoracic;  Laterality: Right;  Right Thoracotomy, Right Upper Lobectomy, and lymphnode sampling  . Abdominal hysterectomy 1980  . Cataract extraction w/ intraocular lens  implant, bilateral 1990's  . Cystocele repair 2007  . Skin cancer excision 05/2011    RUE "above elbow" (03/18/2012)    Outpatient Encounter Prescriptions as of 04/16/2012  Medication Sig Dispense Refill  . acetaminophen (TYLENOL) 500 MG tablet Take 325 mg by mouth every 6 (six) hours as needed.      [name] amiodarone (PACERONE) 200 MG tablet Take 200 mg by mouth daily.      . CELEBREX 200 MG capsule Take 200 mg by mouth as needed. For pain      .  esomeprazole (NEXIUM) 40 MG capsule Take 1 capsule (40 mg total) by mouth daily.  30 capsule  11  . estradiol (VIVELLE-DOT) 0.05 MG/24HR Place 1 patch onto the skin 2 (two) times a week. Sunday & Thursday      . ESTRADIOL VA Place 1 applicator vaginally. Twice a week      . FIBER PO Take 2 Units by mouth daily. 2 units=2 teaspoonfuls      . ibuprofen (ADVIL,MOTRIN) 800 MG tablet Take 1 tablet (800 mg total) by mouth every 8 (eight) hours as needed for pain. For First 72 hours take 800mg  every 8 hours, then use every 8 hours prn pain  30 tablet  0  . levothyroxine (SYNTHROID, LEVOTHROID) 75 MCG tablet Take 75 mcg by mouth daily before breakfast.       . loratadine (CLARITIN) 10 MG tablet Take 10 mg by mouth daily as needed.       . Misc  Natural Products (TART CHERRY ADVANCED) CAPS Take 1 capsule by mouth 2 (two) times daily. Patient takes this Black Cherry 250 mg Capsule twice daily      . mometasone (NASONEX) 50 MCG/ACT nasal spray Place 2 sprays into the nose as needed. For allergies      . Multiple Vitamin (MULTIVITAMIN) capsule Take 1 capsule by mouth daily.       . Probiotic Product (TRUBIOTICS PO) Take 1 capsule by mouth daily.       . [DISCONTINUED] amiodarone (PACERONE) 400 MG tablet Take 200 mg by mouth 2 (two) times daily.      . [DISCONTINUED] metoprolol tartrate (LOPRESSOR) 25 MG tablet Take 25 mg by mouth 2 (two) times daily.        Allergies  Allergen Reactions  . Codeine Nausea And Vomiting    Physical Exam:  Filed Vitals:   04/16/12 1344 04/16/12 1348  BP:  96/60  Pulse:  70  Temp: 98.1 F (36.7 C)   TempSrc: Oral   Height: 5' 1.5" (1.562 m)   Weight: 157 lb (71.215 kg)   SpO2:  95%    Body mass index is 29.18 kg/(m^2). Wt Readings from Last 2 Encounters:  04/16/12 157 lb (71.215 kg)  04/01/12 155 lb 6.4 oz (70.489 kg)    General - No distress ENT - no sinus tenderness, no oral exudate, no LAN Cardiac - s1s2 regular, no murmur, pulses symmetric, no edema Chest - normal respiratory excursion, good air entry, no wheeze/rales/dullness Back - no focal tenderness Abd - soft, non-tender Ext - normal motor strength Neuro - Cranial nerves are normal Skin - no discernible active dermatitis, erythema, urticaria or inflammatory process. Psych - normal mood, and behavior.   Assessment/Plan:  Coralyn Helling, MD [place] [facility]/Critical Care/Sleep Pager:  [phone] 04/16/2012, 2:09 PM

## 2012-04-21 ENCOUNTER — Other Ambulatory Visit: Payer: Self-pay | Admitting: *Deleted

## 2012-04-21 DIAGNOSIS — G8918 Other acute postprocedural pain: Secondary | ICD-10-CM

## 2012-04-21 MED ORDER — IBUPROFEN 800 MG PO TABS: 800.0000 mg | ORAL_TABLET | Freq: Three times a day (TID) | ORAL | Status: DC | PRN

## 2012-05-02 ENCOUNTER — Other Ambulatory Visit: Payer: Self-pay | Admitting: *Deleted

## 2012-05-02 DIAGNOSIS — R918 Other nonspecific abnormal finding of lung field: Secondary | ICD-10-CM

## 2012-05-06 ENCOUNTER — Encounter: Payer: Self-pay | Admitting: Surgery

## 2012-05-06 ENCOUNTER — Ambulatory Visit
Admission: RE | Admit: 2012-05-06 | Discharge: 2012-05-06 | Disposition: A | Discharge status: Home / Self Care | Payer: Medicare Other | Source: Ambulatory Visit | Attending: Surgery | Admitting: Surgery

## 2012-05-06 ENCOUNTER — Ambulatory Visit (INDEPENDENT_AMBULATORY_CARE_PROVIDER_SITE_OTHER): Payer: Self-pay | Admitting: Surgery

## 2012-05-06 VITALS — BP 128/79 | HR 75 | Resp 18 | Ht 61.5 in | Wt 155.0 lb

## 2012-05-06 DIAGNOSIS — R222 Localized swelling, mass and lump, trunk: Secondary | ICD-10-CM

## 2012-05-06 DIAGNOSIS — Z902 Acquired absence of lung [part of]: Secondary | ICD-10-CM

## 2012-05-06 DIAGNOSIS — R918 Other nonspecific abnormal finding of lung field: Secondary | ICD-10-CM

## 2012-05-06 DIAGNOSIS — Z9889 Other specified postprocedural states: Secondary | ICD-10-CM

## 2012-05-06 NOTE — Progress Notes (Signed)
[address]            [name] [id number]          [phone]      HPI:  The patient returns today for followup status post right thoracotomy and right upper lobectomy on 03/13/2012 for removal of a 14 x 9 mm spiculated nodule in the right upper lobe that was hypermetabolic on PET scan. This lesion was too deep in the lung to perform wedge resection. The pathologist was unsure of the diagnosis and therefore it was sent to [facility] for a pathology consultation with Dr. Steffanie Dunn. 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 Since I last saw her about a month ago she has been feeling much better. She has been seeing Dr. Anne Fu for management of postoperative atrial fibrillation and her dose of amiodarone has been decreased. She is walking daily with minimal discomfort and feels that her breathing has improved.  Current Outpatient Prescriptions  Medication Sig Dispense Refill  . acetaminophen (TYLENOL) 500 MG tablet Take 325 mg by mouth every 6 (six) hours as needed.      [name] amiodarone (PACERONE) 200 MG tablet Take 200 mg by mouth 2 (two) times daily.       . CELEBREX 200 MG capsule Take 200 mg by mouth as needed. For pain      . esomeprazole (NEXIUM) 40 MG capsule Take 1 capsule (40 mg total) by mouth daily.  30 capsule  11  . estradiol (VIVELLE-DOT) 0.05 MG/24HR Place 1 patch onto the skin 2 (two) times a week. Sunday & Thursday      . ESTRADIOL VA Place 1 applicator vaginally. Twice a week      . FIBER PO Take 2 Units by mouth daily. 2 units=2 teaspoonfuls      . ibuprofen (ADVIL,MOTRIN) 800 MG tablet Take 1 tablet (800 mg total) by mouth every 8 (eight) hours as needed for pain. For First 72 hours take 800mg  every 8 hours, then use every 8 hours prn pain  30 tablet  0  . levothyroxine (SYNTHROID, LEVOTHROID) 75 MCG tablet Take 75 mcg by mouth daily before  breakfast.       . loratadine (CLARITIN) 10 MG tablet Take 10 mg by mouth daily as needed.       . Misc Natural Products (TART CHERRY ADVANCED) CAPS Take 1 capsule by mouth 2 (two) times daily. Patient takes this Black Cherry 250 mg Capsule twice daily      . mometasone (NASONEX) 50 MCG/ACT nasal spray Place 2 sprays into the nose as needed. For allergies      . Multiple Vitamin (MULTIVITAMIN) capsule Take 1 capsule by mouth daily.       . Probiotic Product (TRUBIOTICS PO) Take 1 capsule by mouth daily.          Physical Exam: BP 128/79  Pulse 75  Resp 18  Ht 5' 1.5" (1.562 m)  Wt 155 lb (70.308 kg)  BMI 28.81 kg/m2  SpO2 94% She looks well. Lung exam is clear. The right chest incision is well-healed. Cardiac exam shows a regular rate and rhythm with normal heart sounds.  Diagnostic Tests:  *RADIOLOGY REPORT*   Clinical Data: Status post right upper lobectomy, weakness   CHEST - 2 VIEW   Comparison: 03/19/2012  Findings: Postsurgical changes in the right hemithorax with volume loss and tenting of the right hemidiaphragm.   No pneumothorax is seen.  Prior subcutaneous emphysema has resolved.   Lungs are otherwise clear.  No pleural effusion.   Cardiomediastinal silhouette is within normal limits.   Right lateral fifth rib fracture deformity.   Surgical clips in the right chest wall/breast.   IMPRESSION: Postsurgical changes in the right hemithorax.  No pneumothorax is seen.     Original Report Authenticated By: Charline Bills, M.D.     Impression:  Overall I think she is making good progress following her surgery. There is no evidence of cancer and I don't think she requires close surveillance like she would if we found a malignancy. Since she did have a heavy smoking history she should probably have a yearly chest x-ray. I will leave that decision up to her primary physician. I told her she could return to driving a car. I asked her not to lift anything  heavier than 10 pounds for 3 months postoperatively.  Plan:  She will followup with Dr. Craige Cotta from pulmonary medicine, Dr. Anne Fu from cardiology and Dr. Ouida Sills. I will be happy to see her back if the need arises.

## 2012-08-20 ENCOUNTER — Other Ambulatory Visit (HOSPITAL_COMMUNITY): Payer: Self-pay | Admitting: Internal Medicine

## 2012-08-20 DIAGNOSIS — Z139 Encounter for screening, unspecified: Secondary | ICD-10-CM

## 2012-09-08 ENCOUNTER — Ambulatory Visit (HOSPITAL_COMMUNITY): Payer: Medicare Other

## 2012-11-05 ENCOUNTER — Ambulatory Visit (INDEPENDENT_AMBULATORY_CARE_PROVIDER_SITE_OTHER): Payer: Medicare Other | Admitting: Pulmonary Disease

## 2012-11-05 ENCOUNTER — Encounter: Payer: Self-pay | Admitting: Pulmonary Disease

## 2012-11-05 VITALS — BP 110/66 | HR 78 | Temp 97.4°F | Ht 61.0 in | Wt 158.0 lb

## 2012-11-05 DIAGNOSIS — R911 Solitary pulmonary nodule: Secondary | ICD-10-CM

## 2012-11-05 DIAGNOSIS — J449 Chronic obstructive pulmonary disease, unspecified: Secondary | ICD-10-CM

## 2012-11-05 LAB — PULMONARY FUNCTION TEST

## 2012-11-05 NOTE — Patient Instructions (Signed)
Follow up with pulmonary as needed 

## 2012-11-05 NOTE — Progress Notes (Signed)
PFT done today. 

## 2012-11-05 NOTE — Progress Notes (Signed)
Chief Complaint  Patient presents with  . COPD    Breathing is unchanged. Reports DOE. Denies chest tightness or coughing. Had PFT done today.    History of Present Illness: [name] is a 78 y.o. female former smoker with mild COPD.  She also has chronic cough from reflux.  She was seen in December by Dr. Laneta Simmers, and was advised she did not need additional thoracic surgery follow up.  CXR from then showed expected changes from Rt VATS.  She gets occasional chest discomfort from her surgery site, but these do not last very long and resolve spontaneously.  These episodes are not associated with any other symptoms.  She is otherwise doing well.  She denies cough, sputum, wheeze, or chest congestion.  She goes for a walk 45 minutes 5 to 6 days per week.  She will notice her breathing gets heavier when walking up hills, but not to the point that she has to stop her activity.  She denies palpitations, or leg swelling.   Tests: CT chest 01/03/12>>14x9 mm RUL nodule PFT 02/08/12>>FEV1 1.63 (95%), FEV1% 65, TLC 4.12 (94%), DLCO 74%, no BD PET scan 02/07/12>>13 mm RUL nodule with 3.4 SUV Rt VATS 03/13/12>>benign lesion PFT 11/05/12 >> FEV1 1.46 (81%), FEV1% 71, TLC 3.92 (85%), DLCO 70%, no BD  She  has a past medical history of Urinary incontinence; Fibroid; GERD (gastroesophageal reflux disease); Lesion of right lung; Hypercholesteremia; Hypothyroidism; Post menopausal syndrome; Burning mouth syndrome; Plantar fascial fibromatosis; COPD (chronic obstructive pulmonary disease) (02/12/2012); H/O hiatal hernia; Arthritis; Lung mass; Atrial fibrillation and flutter; Heart murmur; History of chronic bronchitis; Bloody sputum; and Ocular migraine.  She  has past surgical history that includes Bladder suspension (2007); Colonoscopy; Upper gastrointestinal endoscopy; Eye surgery; Video bronchoscopy (03/13/2012); Thoracotomy (03/13/2012); Lobectomy (03/13/2012); Lymph node biopsy (03/13/2012); Abdominal  hysterectomy (1980); Cataract extraction w/ intraocular lens  implant, bilateral (1990's); Cystocele repair (2007); and Skin cancer excision (05/2011).   Outpatient Encounter Prescriptions as of 11/05/2012  Medication Sig Dispense Refill  . CELEBREX 200 MG capsule Take 200 mg by mouth as needed. For pain      . esomeprazole (NEXIUM) 40 MG capsule Take 1 capsule (40 mg total) by mouth daily.  30 capsule  11  . estradiol (VIVELLE-DOT) 0.05 MG/24HR Place 1 patch onto the skin 2 (two) times a week. Sunday & Thursday      . ESTRADIOL VA Place 1 applicator vaginally. Twice a week      . FIBER PO Take 2 Units by mouth daily. 2 units=2 teaspoonfuls      . levothyroxine (SYNTHROID, LEVOTHROID) 75 MCG tablet Take 75 mcg by mouth daily before breakfast.       . loratadine (CLARITIN) 10 MG tablet Take 10 mg by mouth daily as needed.       . Misc Natural Products (TART CHERRY ADVANCED) CAPS Take 1 capsule by mouth 2 (two) times daily. Patient takes this Black Cherry 250 mg Capsule twice daily      . mometasone (NASONEX) 50 MCG/ACT nasal spray Place 2 sprays into the nose as needed. For allergies      . Multiple Vitamin (MULTIVITAMIN) capsule Take 1 capsule by mouth daily.       . Probiotic Product (TRUBIOTICS PO) Take 1 capsule by mouth daily.       . [DISCONTINUED] acetaminophen (TYLENOL) 500 MG tablet Take 325 mg by mouth every 6 (six) hours as needed.      . [DISCONTINUED] ibuprofen (ADVIL,MOTRIN) 800  MG tablet Take 1 tablet (800 mg total) by mouth every 8 (eight) hours as needed for pain. For First 72 hours take 800mg  every 8 hours, then use every 8 hours prn pain  30 tablet  0  . [DISCONTINUED] amiodarone (PACERONE) 200 MG tablet Take 200 mg by mouth 2 (two) times daily.        No facility-administered encounter medications on file as of 11/05/2012.    Allergies  Allergen Reactions  . Codeine Nausea And Vomiting    Physical Exam:  General - No distress ENT - no sinus tenderness, no oral exudate,  no LAN Cardiac - s1s2 regular Chest - normal respiratory excursion, good air entry, no wheeze/rales/dullness Back - no focal tenderness Abd - soft, non-tender Ext - no edema Neuro - normal strength Skin - no rashes Psych - normal mood, and behavior  Dg Chest 2 View  05/06/2012   *RADIOLOGY REPORT*  Clinical Data: Right upper lobectomy for resection of right lung nodule, follow-up  CHEST - 2 VIEW  Comparison: Chest x-ray of 04/01/2012  Findings: Elevation of the right hemidiaphragm is again noted consistent with volume loss postoperatively and atelectasis.  There may be a tiny right effusion present.  The left lung is clear. Heart size is stable.  IMPRESSION: Relatively stable changes of right upper lobectomy with volume loss on the right and possible small right effusion.   Original Report Authenticated By: Dwyane Dee, M.D.     Assessment/Plan:  Coralyn Helling, MD [place] [facility]/Critical Care/Sleep Pager:  [phone] 11/05/2012, 4:17 PM

## 2012-11-06 NOTE — Assessment & Plan Note (Signed)
She has very mild findings on PFT and no significant respiratory symptoms.  I do not think she needs inhaler therapy at this time.  Advised she can f/u with her PCP and return to pulmonary if she develops more respiratory symptoms.

## 2012-11-06 NOTE — Assessment & Plan Note (Signed)
This was a benign lesion on biopsy.  She has recovered well after Rt VATS.  I explained that it is not uncommon to have intermittent discomfort after this type of surgery.  If this persist, then she should speak with Dr. Laneta Simmers with thoracic surgery.

## 2012-12-23 ENCOUNTER — Other Ambulatory Visit: Payer: Self-pay

## 2012-12-23 MED ORDER — ESTRADIOL 0.05 MG/24HR TD PTTW: 1.0000 | MEDICATED_PATCH | TRANSDERMAL | Status: DC

## 2012-12-23 NOTE — Telephone Encounter (Signed)
Patient has her annual exam scheduled in Aug 2014.

## 2013-01-13 ENCOUNTER — Encounter: Payer: Self-pay | Admitting: Gynecology

## 2013-01-13 ENCOUNTER — Ambulatory Visit (INDEPENDENT_AMBULATORY_CARE_PROVIDER_SITE_OTHER): Payer: Medicare Other | Admitting: Gynecology

## 2013-01-13 VITALS — BP 104/68 | Ht 63.0 in | Wt 154.0 lb

## 2013-01-13 DIAGNOSIS — N952 Postmenopausal atrophic vaginitis: Secondary | ICD-10-CM

## 2013-01-13 DIAGNOSIS — N816 Rectocele: Secondary | ICD-10-CM

## 2013-01-13 DIAGNOSIS — N3941 Urge incontinence: Secondary | ICD-10-CM

## 2013-01-13 DIAGNOSIS — Z79890 Hormone replacement therapy (postmenopausal): Secondary | ICD-10-CM

## 2013-01-13 MED ORDER — ESTRADIOL 0.05 MG/24HR TD PTTW: 1.0000 | MEDICATED_PATCH | TRANSDERMAL | Status: DC

## 2013-01-13 MED ORDER — NONFORMULARY OR COMPOUNDED ITEM: Status: DC

## 2013-01-13 NOTE — Progress Notes (Signed)
[name] 12-12-1934 [id number]   History:    78 y.o.  for followup. Patient states that she still has urinary incontinence at times but not as much as when she had her sling procedure done at the time of her anterior colporrhaphy back in 2007. Patient does not have any true nocturia. Her leakage of urine is only if her bladder is full. Several years prior to that as a result of her fibroid uterus she had a total abdominal hysterectomy. Patient's lab work is being done by Dr. Kathyrn Lass her primary physician in [place], [place]. Patient denies any prior history of abnormal Pap smear. Her last bone density study was normal in 2010. She had a colonoscopy in 2013 whereby benign polyps were removed. Her shingles vaccine is up-to-date. She is currently on Vivelle-Dot 0.05 mg twice a week and twice a week applied estradiol 0.02% vaginal cream for vaginal atrophy. Past medical history,surgical history, family history and social history were all reviewed and documented in the EPIC chart.  Gynecologic History No LMP recorded. Patient has had a hysterectomy. Contraception: status post hysterectomy Last Pap: 2012. Results were: normal Last mammogram: 2013. Results were: normal  Obstetric History OB History  Gravida Para Term Preterm AB SAB TAB Ectopic Multiple Living  2 2 2       2     # Outcome Date GA Lbr Len/2nd Weight Sex Delivery Anes PTL Lv  2 TRM           1 TRM                ROS: A ROS was performed and pertinent positives and negatives are included in the history.  GENERAL: No fevers or chills. HEENT: No change in vision, no earache, sore throat or sinus congestion. NECK: No pain or stiffness. CARDIOVASCULAR: No chest pain or pressure. No palpitations. PULMONARY: No shortness of breath, cough or wheeze. GASTROINTESTINAL: No abdominal pain, nausea, vomiting or diarrhea, melena or bright red blood per rectum. GENITOURINARY: urgency incontinenceMUSCULOSKELETAL: No joint or muscle pain, no back  pain, no recent trauma. DERMATOLOGIC: No rash, no itching, no lesions. ENDOCRINE: No polyuria, polydipsia, no heat or cold intolerance. No recent change in weight. HEMATOLOGICAL: No anemia or easy bruising or bleeding. NEUROLOGIC: No headache, seizures, numbness, tingling or weakness. PSYCHIATRIC: No depression, no loss of interest in normal activity or change in sleep pattern.     Exam: chaperone present  BP 104/68  Ht 5\' 3"  (1.6 m)  Wt 154 lb (69.854 kg)  BMI 27.29 kg/m2  Body mass index is 27.29 kg/(m^2).  General appearance : Well developed well nourished female. No acute distress HEENT: Neck supple, trachea midline, no carotid bruits, no thyroidmegaly Lungs: Clear to auscultation, no rhonchi or wheezes, or rib retractions  Heart: Regular rate and rhythm, no murmurs or gallops Breast:Examined in sitting and supine position were symmetrical in appearance, no palpable masses or tenderness,  no skin retraction, no nipple inversion, no nipple discharge, no skin discoloration, no axillary or supraclavicular lymphadenopathy Abdomen: no palpable masses or tenderness, no rebound or guarding Extremities: no edema or skin discoloration or tenderness  Pelvic:  Bartholin, Urethra, Skene Glands: Within normal limits             Vagina: No gross lesions or discharge, vaginal atrophy, small first degree rectocele  Cervix: absent  Uterus Absent  Adnexa  Without masses or tenderness  Anus and perineum  normal   Rectovaginal  normal sphincter tone without palpated masses or tenderness  Hemoccult Card provided     Assessment/Plan:  78 y.o. female with vaginal atrophy has done well with estradiol 0.02% twice a week vaginally as well as twice a week  Vivelle Dot 0.05 mg. We once again discussed women's health initiative study. We discussed the risks benefits and pros and cons of hormone replacement therapy to include breast cancer. She is comfortable with the regimen that she is on. She  fully understands and accepts potential risks. She was instructed to continue with her calcium and vitamin D and regular exercise. She will schedule her bone density study since it is overdue as well as her mammogram. She was encouraged to do her monthly self breast examination. No lab work was done today her primary physician will be doing them.    Ok Edwards MD, 4:28 PM 01/13/2013

## 2013-01-13 NOTE — Patient Instructions (Addendum)
Bone Densitometry Bone densitometry is a special X-ray that measures your bone density and can be used to help predict your risk of bone fractures. This test is used to determine bone mineral content and density to diagnose osteoporosis. Osteoporosis is the loss of bone that may cause the bone to become weak. Osteoporosis commonly occurs in women entering menopause. However, it may be found in men and in people with other diseases. PREPARATION FOR TEST No preparation necessary. WHO SHOULD BE TESTED?  All women older than 65.  Postmenopausal women (50 to 65) with risk factors for osteoporosis.  People with a previous fracture caused by normal activities.  People with a small body frame (less than 127 poundsor a body mass index [BMI] of less than 21).  People who have a parent with a hip fracture or history of osteoporosis.  People who smoke.  People who have rheumatoid arthritis.  Anyone who engages in excessive alcohol use (more than 3 drinks most days).  Women who experience early menopause. WHEN SHOULD YOU BE RETESTED? Current guidelines suggest that you should wait at least 2 years before doing a bone density test again if your first test was normal.Recent studies indicated that women with normal bone density may be able to wait a few years before needing to repeat a bone density test. You should discuss this with your caregiver.  NORMAL FINDINGS   Normal: less than standard deviation below normal (greater than -1).  Osteopenia: 1 to 2.5 standard deviations below normal (-1 to -2.5).  Osteoporosis: greater than 2.5 standard deviations below normal (less than -2.5). Test results are reported as a "T score" and a "Z score."The T score is a number that compares your bone density with the bone density of healthy, young women.The Z score is a number that compares your bone density with the scores of women who are the same age, gender, and race.  Ranges for normal findings may vary  among different laboratories and hospitals. You should always check with your doctor after having lab work or other tests done to discuss the meaning of your test results and whether your values are considered within normal limits. MEANING OF TEST  Your caregiver will go over the test results with you and discuss the importance and meaning of your results, as well as treatment options and the need for additional tests if necessary. OBTAINING THE TEST RESULTS It is your responsibility to obtain your test results. Ask the lab or department performing the test when and how you will get your results. Document Released: 06/05/2004 Document Revised: 08/06/2011 Document Reviewed: 06/28/2010 [employer] Patient Information 2014 [employer].  

## 2013-01-21 ENCOUNTER — Telehealth: Payer: Self-pay

## 2013-01-21 MED ORDER — ESTRADIOL 0.05 MG/24HR TD PTTW: 1.0000 | MEDICATED_PATCH | TRANSDERMAL | Status: DC

## 2013-01-21 NOTE — Telephone Encounter (Signed)
Pharmacy called and said patient requests 3 mos supply at at time. Rx escribed for this.

## 2013-01-22 ENCOUNTER — Encounter: Payer: Self-pay | Admitting: Gynecology

## 2013-02-04 ENCOUNTER — Other Ambulatory Visit: Payer: Medicare Other | Admitting: Anesthesiology

## 2013-02-04 DIAGNOSIS — Z1211 Encounter for screening for malignant neoplasm of colon: Secondary | ICD-10-CM

## 2013-04-02 ENCOUNTER — Other Ambulatory Visit: Payer: Self-pay

## 2013-04-27 ENCOUNTER — Other Ambulatory Visit: Payer: Self-pay | Admitting: Obstetrics and Gynecology

## 2013-07-02 ENCOUNTER — Ambulatory Visit (INDEPENDENT_AMBULATORY_CARE_PROVIDER_SITE_OTHER): Payer: Medicare Other | Admitting: Adult Health

## 2013-07-02 ENCOUNTER — Encounter: Payer: Self-pay | Admitting: Adult Health

## 2013-07-02 VITALS — BP 124/68 | Ht 61.0 in | Wt 161.5 lb

## 2013-07-02 DIAGNOSIS — N952 Postmenopausal atrophic vaginitis: Secondary | ICD-10-CM

## 2013-07-02 DIAGNOSIS — Z1389 Encounter for screening for other disorder: Secondary | ICD-10-CM

## 2013-07-02 HISTORY — DX: Postmenopausal atrophic vaginitis: N95.2

## 2013-07-02 LAB — POCT URINALYSIS DIPSTICK
Glucose, UA: NEGATIVE
Leukocytes, UA: NEGATIVE
Nitrite, UA: NEGATIVE
PROTEIN UA: NEGATIVE
RBC UA: NEGATIVE

## 2013-07-02 MED ORDER — ESTROGENS, CONJUGATED 0.625 MG/GM VA CREA: TOPICAL_CREAM | VAGINAL | Status: DC

## 2013-07-02 NOTE — Patient Instructions (Signed)
Use premarin  Cream daily x 2 weeks .5 gm in vagina  Return in  2 weeks

## 2013-07-02 NOTE — Assessment & Plan Note (Signed)
Will try premarin cream daily x 2 weeks

## 2013-07-02 NOTE — Progress Notes (Signed)
Subjective:     Patient ID: [name], female   DOB: 03/20/1935, 79 y.o.   MRN: [id number]  HPI [name] is a pleasant 79 year old white female,new to practice, in complaining of having a vaginal discharge,that has been bright red like blood to brownish in color with odor at times.And this has been happening since the Fall.She was seen in August by Dr Toney Rakes in [place].She is on vivelle patch and uses estradiol vaginal cream 2 x weekly til she stopped about 2 weeks ago.She is sp hysterectomy for fibroids and has had a sling since 2007 or so.She still has some urinary incontinence and wears a pad. She is not sexually active.  Review of Systems See HPI Reviewed past medical,surgical, social and family history. Reviewed medications and allergies.     Objective:   Physical Exam BP 124/68  Ht 5\' 1"  (1.549 m)  Wt 161 lb 8 oz (73.256 kg)  BMI 30.53 kg/m2   urine dipstick negative, Skin warm and dry.Pelvic: external genitalia is normal in appearance for age, vagina:is atrophic and red with strawberry appearance, no discharge without odor,no lesions noted, cervix and uterus are absent,adnexa: no masses or tenderness noted.   Assessment:     Vaginal atrophy    Plan:     Will try premarin vaginal cream 0.5 gm daily x 2 weeks  Number of samples 4 Lot number [id number]     Exp date 10/15   Pat bottom dry or use wet wipes Follow up in 2 weeks

## 2013-07-16 ENCOUNTER — Encounter: Payer: Self-pay | Admitting: Adult Health

## 2013-07-16 ENCOUNTER — Ambulatory Visit (INDEPENDENT_AMBULATORY_CARE_PROVIDER_SITE_OTHER): Payer: Medicare Other | Admitting: Adult Health

## 2013-07-16 VITALS — BP 120/78 | Ht 61.0 in | Wt 161.0 lb

## 2013-07-16 DIAGNOSIS — N952 Postmenopausal atrophic vaginitis: Secondary | ICD-10-CM

## 2013-07-16 MED ORDER — METRONIDAZOLE 500 MG PO TABS: 500.0000 mg | ORAL_TABLET | Freq: Two times a day (BID) | ORAL | Status: DC

## 2013-07-16 NOTE — Patient Instructions (Signed)
Take flagyl 1 bid x 7 days Use estrace cream 3 x week;y follow up prn

## 2013-07-16 NOTE — Progress Notes (Signed)
Subjective:     Patient ID: [name], female   DOB: 10-16-1934, 79 y.o.   MRN: [id number]  HPI [name] is back in follow up of using premarin vaginal cream daily x 2 weeks.Has had decrease in discharge, no red brown in over 2 days.  Review of Systems See HPI Reviewed past medical,surgical, social and family history. Reviewed medications and allergies.     Objective:   Physical Exam BP 120/78  Ht 5\' 1"  (1.549 m)  Wt 161 lb (73.029 kg)  BMI 30.44 kg/m2   Skin warm and dry.Pelvic: external genitalia is normal in appearance for age, vagina: has better moisture, less strawberry appearance slight discharge, cervix and uterus are absent, adnexa: no masses or tenderness noted. Dr [name] in to co examine  Assessment:     Vaginal atrophy     Plan:     Rx flagyl 500 mg 1 bid x 7 days Use estrace cream 3 x weekly Follow up prn

## 2013-07-21 ENCOUNTER — Telehealth: Payer: Self-pay | Admitting: *Deleted

## 2013-07-21 NOTE — Telephone Encounter (Signed)
vivelle dot approved through 07/21/14 under medicare part d.

## 2013-07-21 NOTE — Telephone Encounter (Signed)
Prior authorization form filled out and faxed to [facility] for vivelle dot 0.05 mg patch, will wait for response.

## 2013-07-28 ENCOUNTER — Other Ambulatory Visit (INDEPENDENT_AMBULATORY_CARE_PROVIDER_SITE_OTHER): Payer: Self-pay | Admitting: Internal Medicine

## 2013-08-05 ENCOUNTER — Ambulatory Visit: Payer: Medicare Other | Admitting: Cardiology

## 2013-08-11 ENCOUNTER — Ambulatory Visit (INDEPENDENT_AMBULATORY_CARE_PROVIDER_SITE_OTHER): Payer: Medicare Other | Admitting: Cardiology

## 2013-08-11 ENCOUNTER — Encounter: Payer: Self-pay | Admitting: Cardiology

## 2013-08-11 VITALS — BP 115/70 | HR 94 | Ht 61.0 in | Wt 160.0 lb

## 2013-08-11 DIAGNOSIS — I4891 Unspecified atrial fibrillation: Secondary | ICD-10-CM

## 2013-08-11 DIAGNOSIS — I471 Supraventricular tachycardia: Secondary | ICD-10-CM

## 2013-08-11 DIAGNOSIS — I498 Other specified cardiac arrhythmias: Secondary | ICD-10-CM

## 2013-08-11 NOTE — Progress Notes (Signed)
[facility]. [address], [address] [place], [place]  [address] Phone: [phone] Fax:  [phone]  Date:  08/11/2013   ID:  [name], DOB [birth year], MRN [id number]  PCP:  Asencion Noble, MD   History of Present Illness: [name] is a 79 y.o. female who I originally saw prior to thoracotomy at the request of Dr. Cyndia Bent here for followup/atrial fibrillation/SVT previously on amiodarone.   She underwent nuclear stress test on 02/28/12 that was low risk with no ischemia. Her echocardiogram also on that date showed normal EF with trace MR, diastolic dysfunction. Her prior EKG on 02/27/12, personally viewed showed sinus rhythm with poor R-wave progression possible old anterior infarct pattern. Interestingly, her EKG post op looked like a supraventricular tachycardia, possible reciprocating with retrograde P wave in V1 at 112 beats per minute.  Went into AFIB post op. Placed on amiodarone.   Had SVT. Metoprolol started, she felt lethargic on this medication. Amiodarone continued. She is in sinus rhythm/sinus bradycardia. I discontinued metoprolol and continued amiodarone 200 mg.   Over the last 3 months and stopping both amiodarone and metoprolol, she's had rare palpitations. She did have an episode approximately 2 weeks ago that lasted one hours duration. She was housecleaning at the time. She did not have any significant shortness of breath or chest pain with this. She did not feel faint. It terminated on its own.     Wt Readings from Last 3 Encounters:  08/11/13 160 lb (72.576 kg)  07/16/13 161 lb (73.029 kg)  07/02/13 161 lb 8 oz (73.256 kg)     Past Medical History  Diagnosis Date  . Urinary incontinence     "slight bit" (03/18/2012)  . Fibroid   . GERD (gastroesophageal reflux disease)   . Lesion of right lung   . Hypercholesteremia   . Hypothyroidism   . Post menopausal syndrome   . Burning mouth syndrome   . Plantar fascial fibromatosis   . COPD (chronic  obstructive pulmonary disease) 02/12/2012    PFT 02/08/12>>FEV1 1.63 (95%), FEV1% 65, TLC 4.12 (94%), DLCO 74%, no BD   . H/O hiatal hernia   . Arthritis     hands   . Lung mass     R upper lobe   . Atrial fibrillation and flutter   . Heart murmur     02/2012 treadmill & echo, told that she has a "leak", but cleared for surgery    . History of chronic bronchitis     "controlled after GERD controlled" (03/18/2012)  . Bloody sputum     "started 12/2011" (03/18/2012)  . Ocular migraine     "I have the flashing lights" (03/18/2012)  . Vaginal atrophy 07/02/2013    Past Surgical History  Procedure Laterality Date  . Bladder suspension  2007    Anterior repair  . Colonoscopy    . Upper gastrointestinal endoscopy    . Eye surgery      implants in both eyes   . Video bronchoscopy  03/13/2012    Procedure: VIDEO BRONCHOSCOPY;  Surgeon: Gaye Pollack, MD;  Location: [facility];  Service: Thoracic;  Laterality: N/A;  . Thoracotomy  03/13/2012    Procedure: THORACOTOMY MAJOR;  Surgeon: Gaye Pollack, MD;  Location: [facility];  Service: Thoracic;  Laterality: Right;  Right Thoracotomy, Right Upper Lobectomy, and lymphnode sampling  . Lobectomy  03/13/2012    Procedure: LOBECTOMY;  Surgeon: Gaye Pollack, MD;  Location: [facility];  Service: Thoracic;  Laterality: Right;  Right Thoracotomy, Right Upper Lobectomy, and lymphnode sampling  . Lymph node biopsy  03/13/2012    Procedure: LYMPH NODE BIOPSY;  Surgeon: Gaye Pollack, MD;  Location: [facility];  Service: Thoracic;  Laterality: Right;  Right Thoracotomy, Right Upper Lobectomy, and lymphnode sampling  . Abdominal hysterectomy  1980  . Cataract extraction w/ intraocular lens  implant, bilateral  1990's  . Cystocele repair  2007  . Skin cancer excision  05/2011    RUE "above elbow" (03/18/2012)    Current Outpatient Prescriptions  Medication Sig Dispense Refill  . CELEBREX 200 MG capsule Take 200 mg by mouth as needed. For pain      . estradiol  (VIVELLE-DOT) 0.05 MG/24HR patch Place 1 patch (0.05 mg total) onto the skin 2 (two) times a week. Sunday & Thursday  24 patch  3  . FIBER PO Take 2 Units by mouth daily. 2 units=2 teaspoonfuls      . levothyroxine (SYNTHROID, LEVOTHROID) 75 MCG tablet Take 75 mcg by mouth daily before breakfast.       . loratadine (CLARITIN) 10 MG tablet Take 10 mg by mouth daily as needed.       . Misc Natural Products (TART CHERRY ADVANCED) CAPS Take 1 capsule by mouth 2 (two) times daily. Patient takes this Black Cherry 250 mg Capsule twice daily      . mometasone (NASONEX) 50 MCG/ACT nasal spray Place 2 sprays into the nose as needed. For allergies      . Multiple Vitamin (MULTIVITAMIN) capsule Take 1 capsule by mouth daily.       [name] NEXIUM 40 MG capsule TAKE ONE CAPSULE BY MOUTH TWICE A DAY  60 capsule  5  . Probiotic Product (TRUBIOTICS PO) Take 1 capsule by mouth daily.        No current facility-administered medications for this visit.    Allergies:    Allergies  Allergen Reactions  . Codeine Nausea And Vomiting    Social History:  The patient  reports that she quit smoking about 37 years ago. Her smoking use included Cigarettes. She has a 20 pack-year smoking history. She has never used smokeless tobacco. She reports that she does not drink alcohol or use illicit drugs.   ROS:  Please see the history of present illness.   Denies any fevers, chills, orthopnea, PND    PHYSICAL EXAM: VS:  BP 115/70  Pulse 94  Ht 5\' 1"  (1.549 m)  Wt 160 lb (72.576 kg)  BMI 30.25 kg/m2 Well nourished, well developed, in no acute distress HEENT: normal Neck: no JVD Cardiac:  normal S1, S2; RRR; no murmur Lungs:  clear to auscultation bilaterally, no wheezing, rhonchi or rales Abd: soft, nontender, no hepatomegaly Ext: no edema Skin: warm and dry Neuro: no focal abnormalities noted  EKG:  Normal sinus rhythm, no changes     ASSESSMENT AND PLAN:  1. SVT/palpitations/no further occurrence of atrial  fibrillation. Doing well. Not on metoprolol or amiodarone. Doing well. 2. We will see back in one year.  Signed, Candee Furbish, MD [facility]  08/11/2013 1:35 PM

## 2013-08-11 NOTE — Patient Instructions (Signed)
Your physician recommends that you continue on your current medications as directed. Please refer to the Current Medication list given to you today.  Your physician wants you to follow-up in: 1 year with Dr. Skains. You will receive a reminder letter in the mail two months in advance. If you don't receive a letter, please call our office to schedule the follow-up appointment.  

## 2013-09-02 IMAGING — CR DG CHEST 1V PORT
1 series · 1 of 1 positions shown · non-contrast
Comparison: 02/14/2012

CLINICAL DATA: Right-sided chest pain and shortness of breath.
Recent right upper lobectomy.

PORTABLE CHEST - 1 VIEW

[AP]
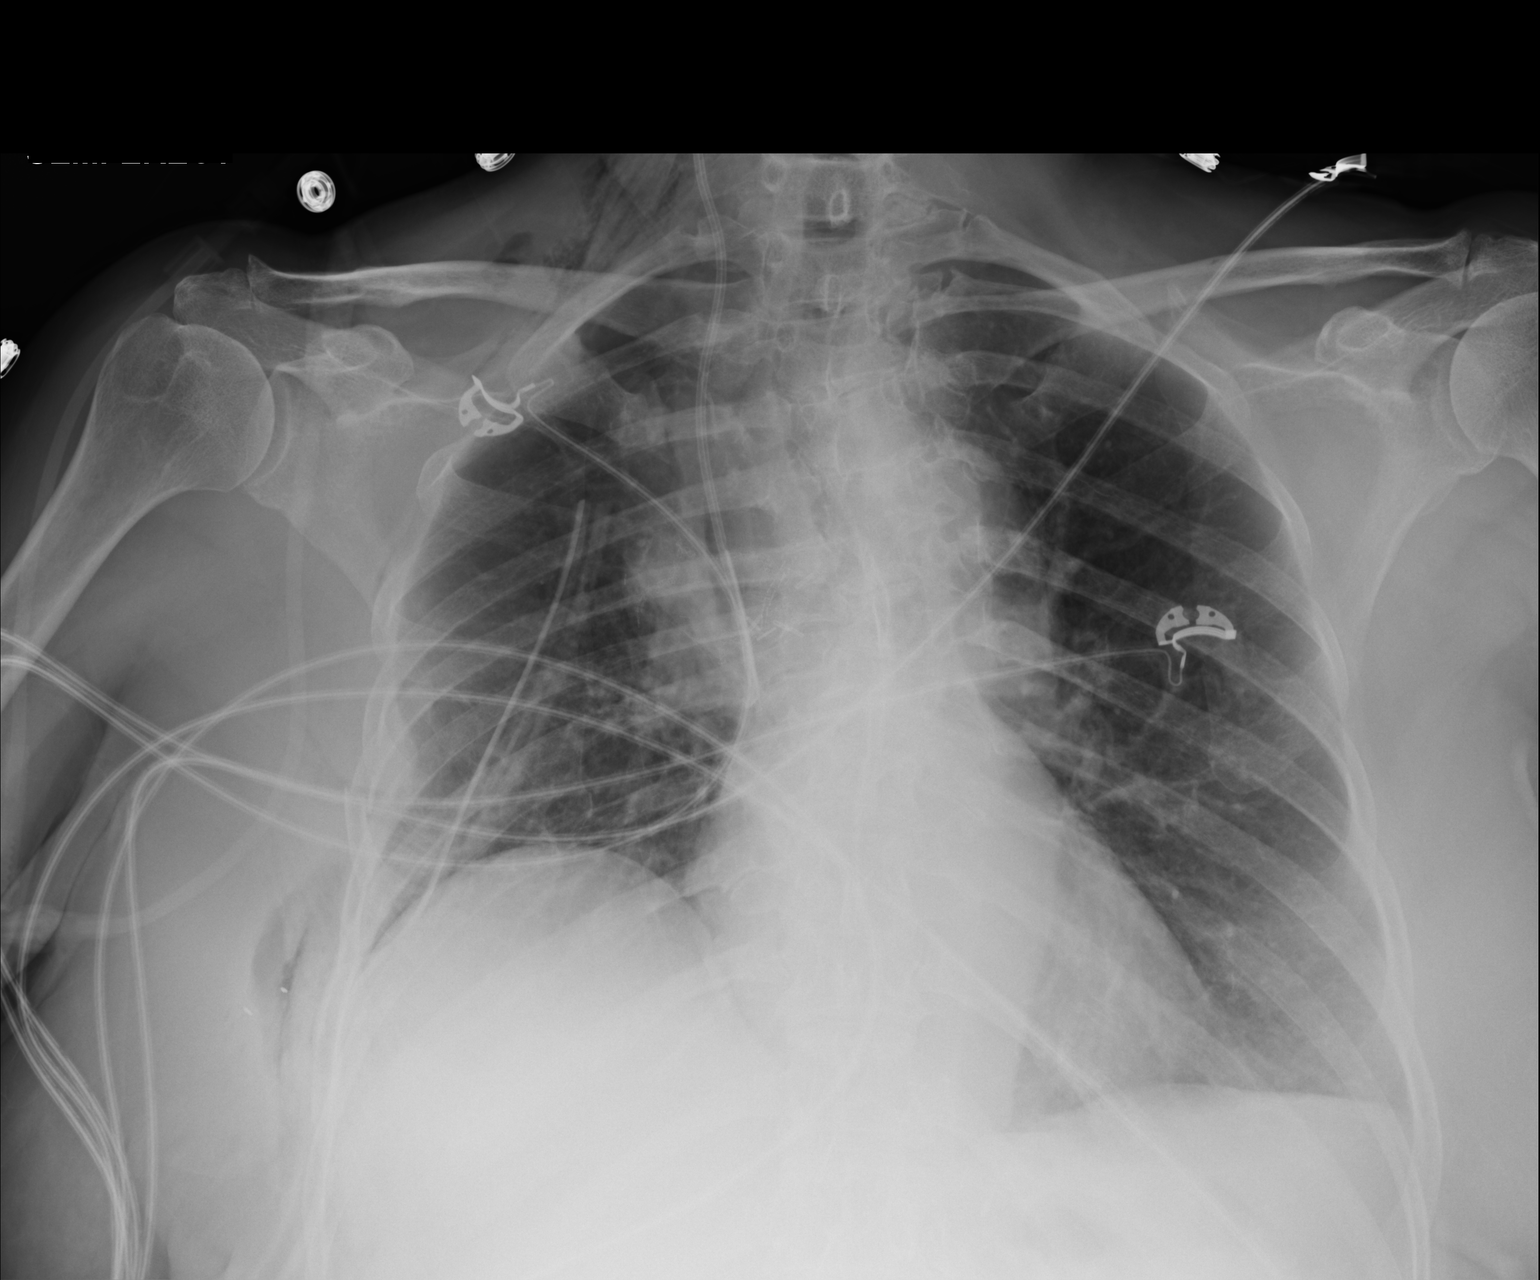

[1 of 1 positions shown; findings below may reference images not displayed]

FINDINGS: Subcutaneous emphysema in the right supraclavicular
region has increased. One right-sided chest tube has been removed.
Central line in good position.  No visible pneumothorax.

Left lung is clear.
IMPRESSION: No visible pneumothorax after removal point of two chest tubes.
However, there is slight increase in the right supraclavicular
subcutaneous emphysema.

## 2013-09-04 IMAGING — CR DG CHEST 2V
2 series · 2 of 2 positions shown · non-contrast
Comparison: .  03/18/2012 at [DATE] a.m.

CLINICAL DATA: Status post chest tube removal, evaluate
pneumothorax

CHEST - 2 VIEW

[w chest pa]
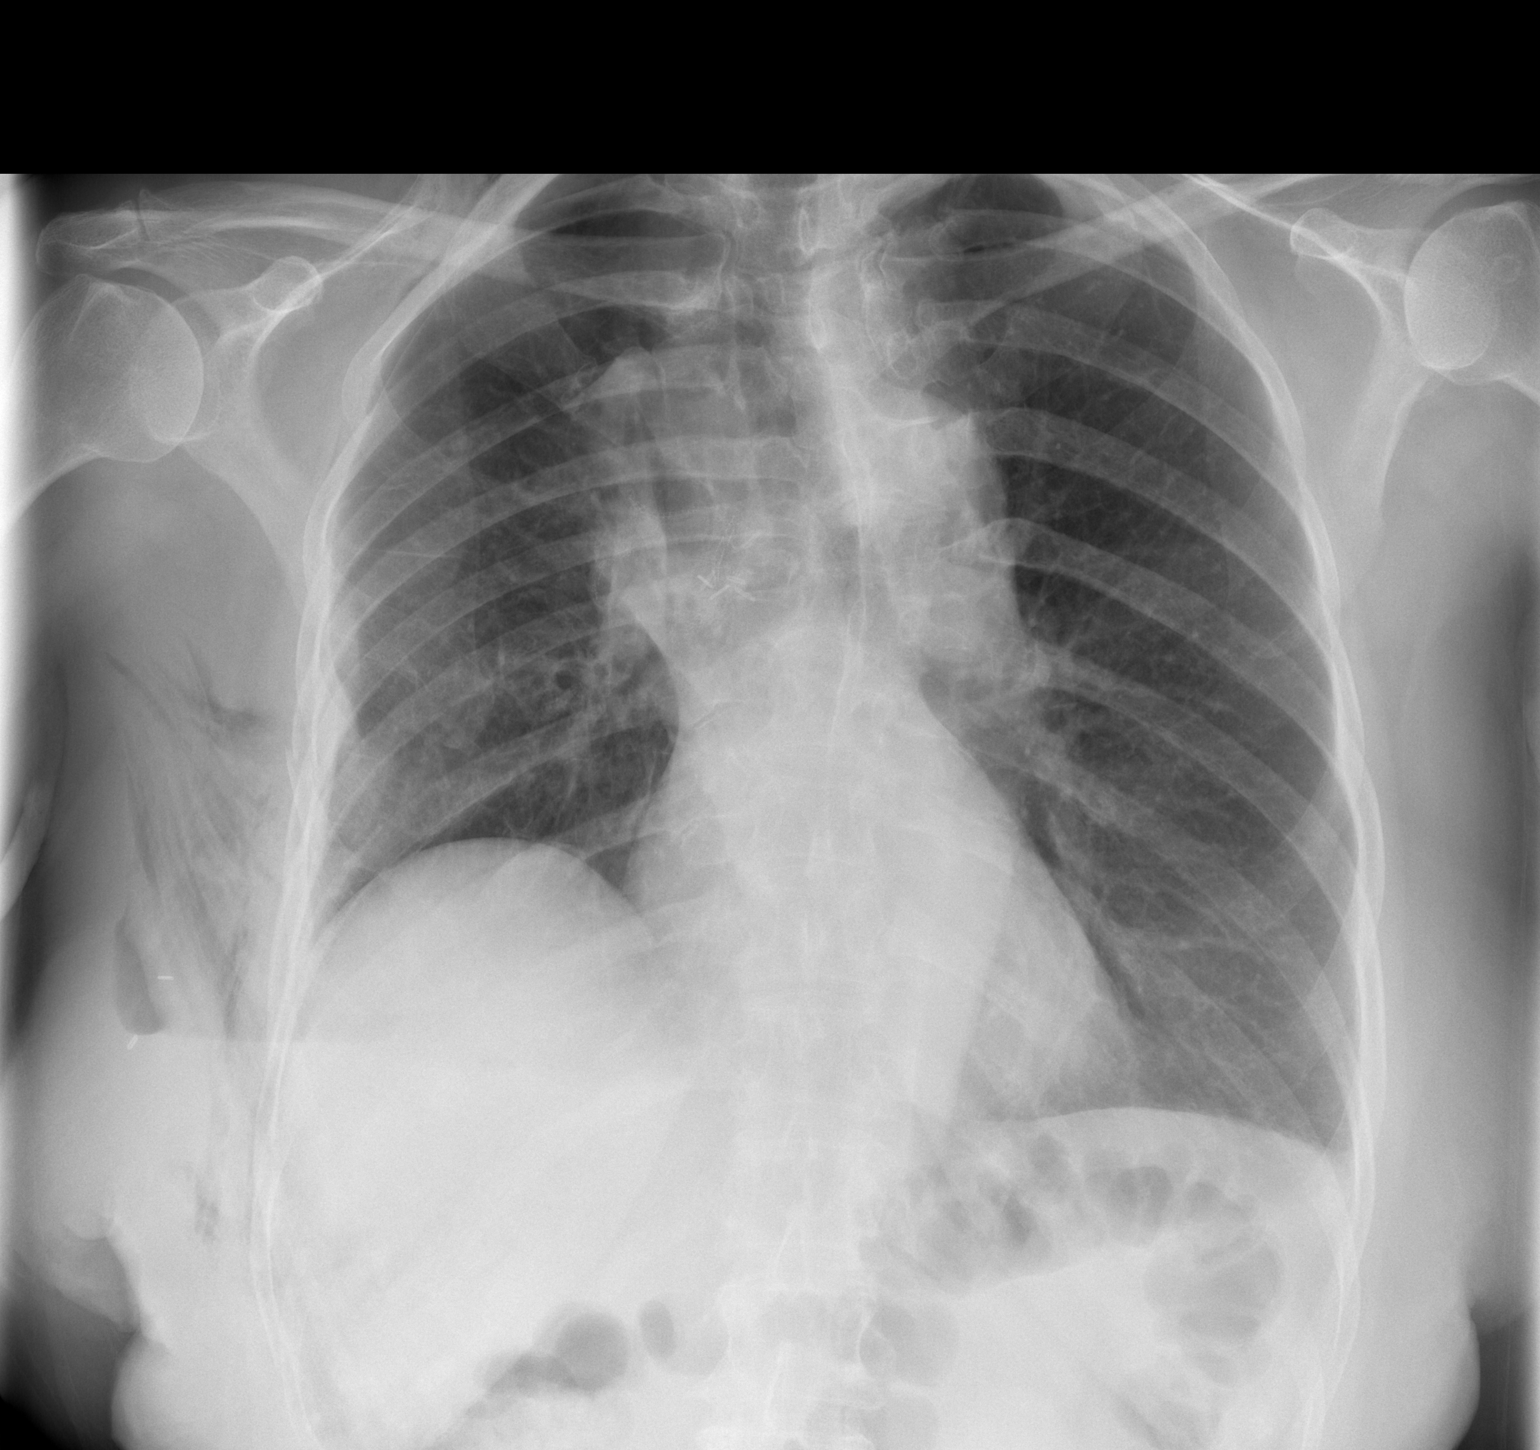

[w chest lat]
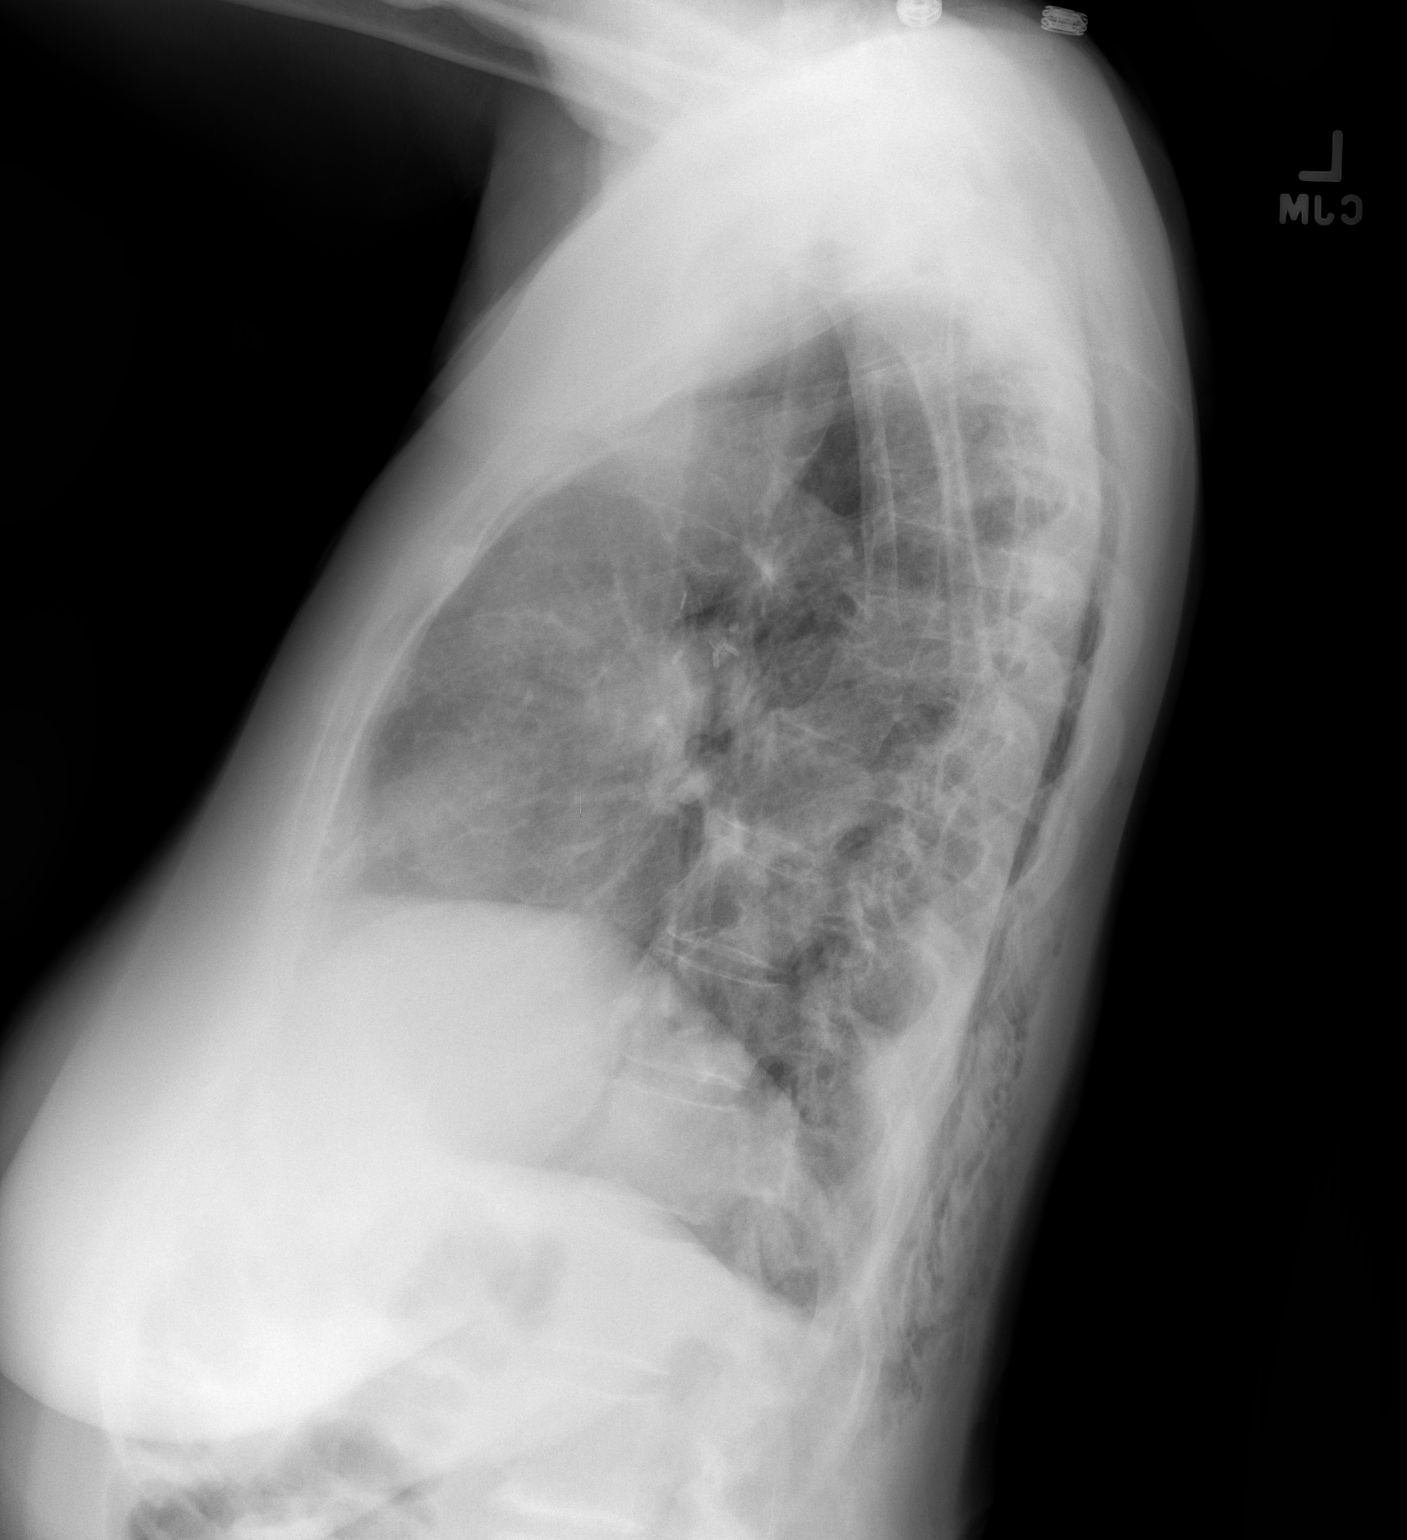

[2 of 2 positions shown; findings below may reference images not displayed]

FINDINGS: Heart size and vascular pattern are normal.  Left lung is
clear.  Right chest tube has been removed.  Right rib fractures
again identified with stable subcutaneous emphysema on the right.
A 3-4 mm right pneumothorax is stable.
IMPRESSION: Stable tiny right pneumothorax status post chest tube removal.

## 2013-09-04 IMAGING — CR DG CHEST 2V
2 series · 2 of 2 positions shown · non-contrast
Comparison: March 17, 2012.

CLINICAL DATA: Status post right upper lobectomy.

CHEST - 2 VIEW

[w chest pa]
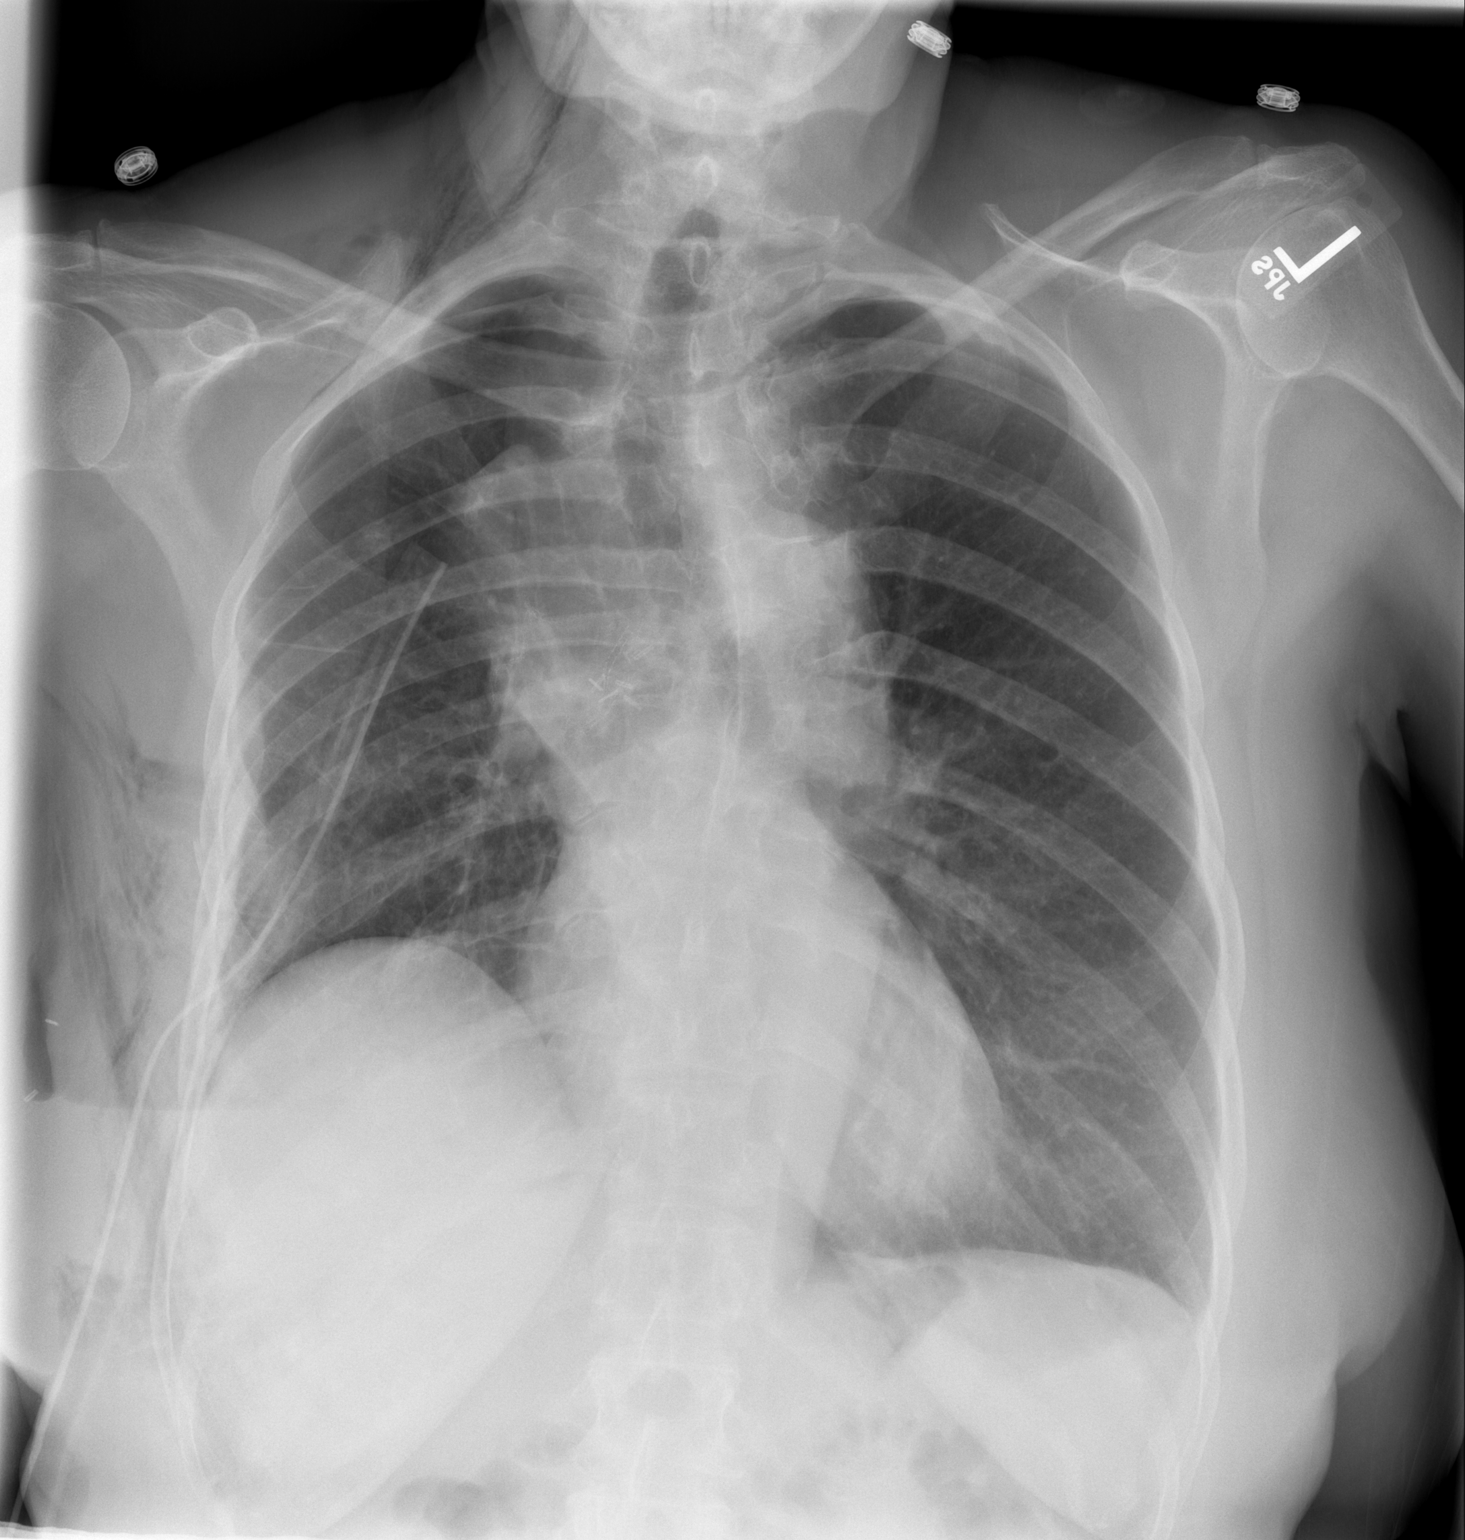

[w chest lat]
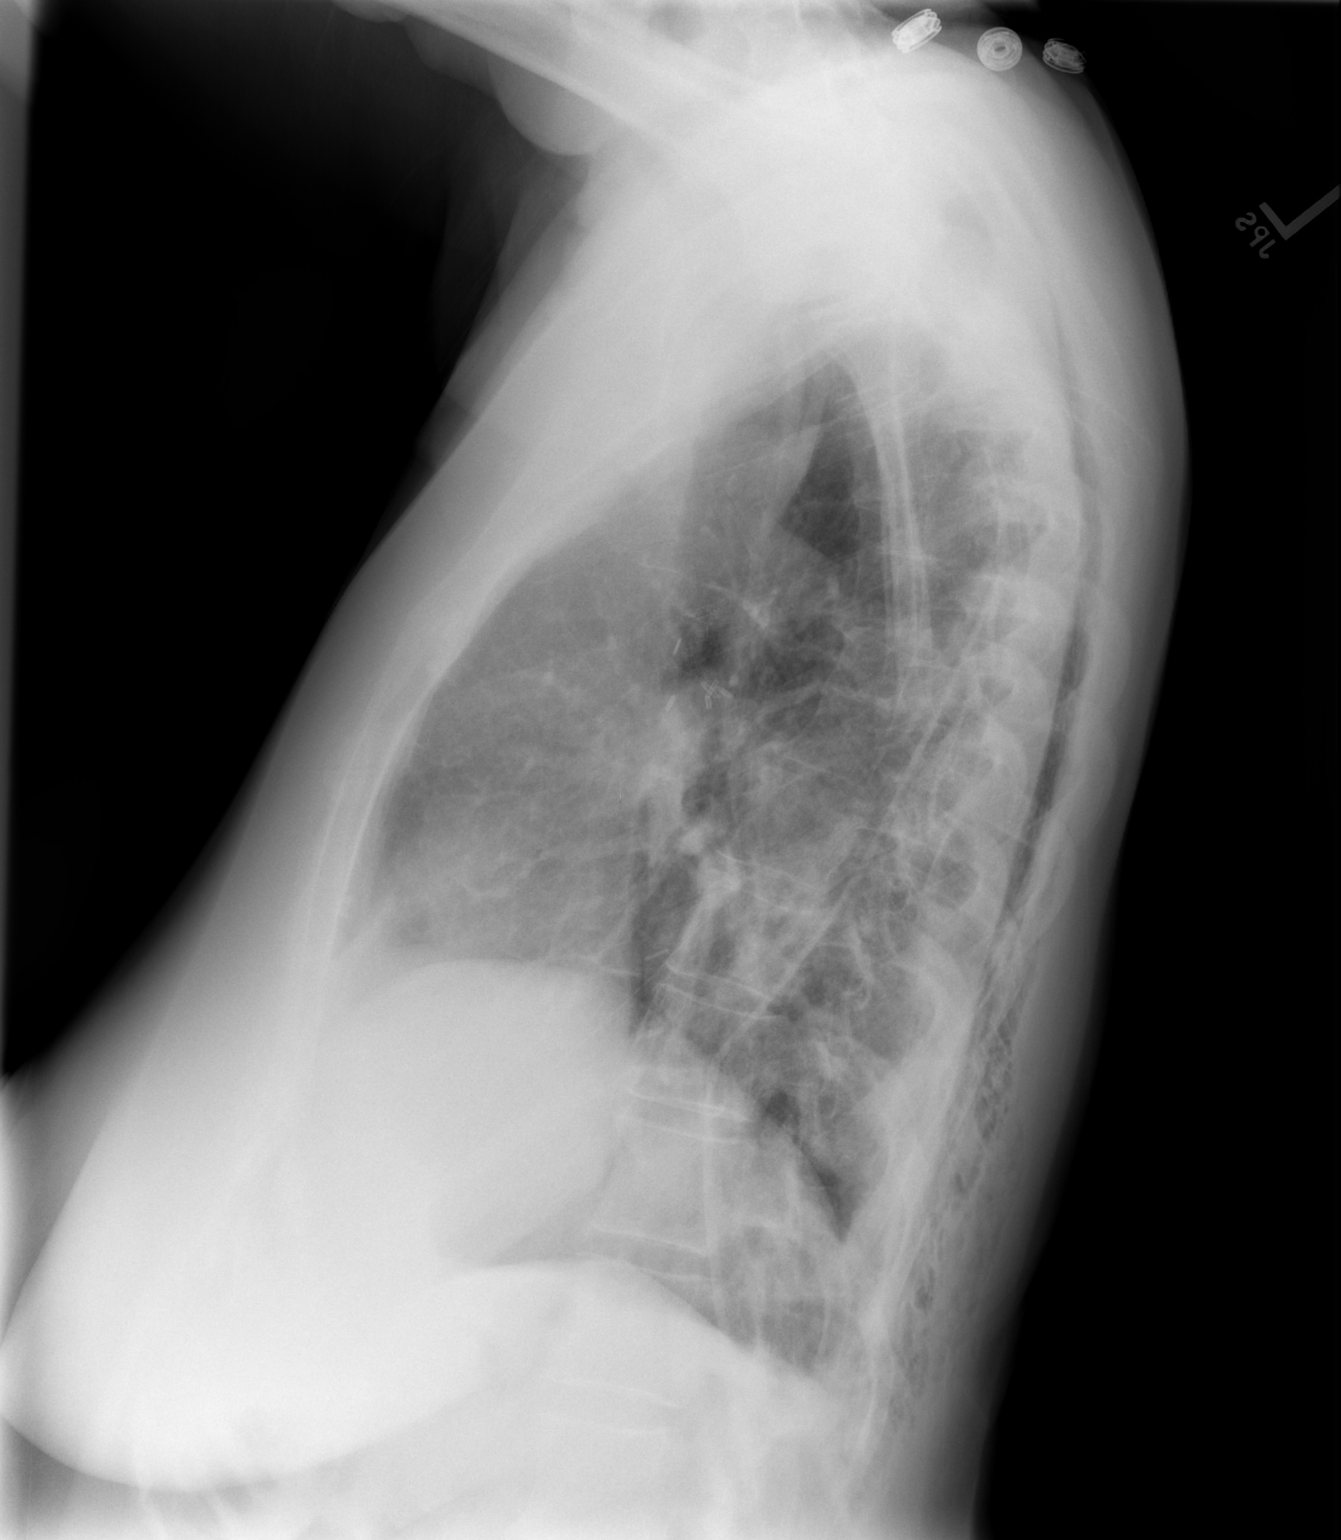

[2 of 2 positions shown; findings below may reference images not displayed]

FINDINGS: Right paratracheal prominence is again noted and
unchanged compared to prior exam, potentially representing
postsurgical soft tissue edema.  Left lung is clear.  The right-
sided chest tube is again noted with subcutaneous emphysema seen
laterally and posteriorly around the right chest as well as in the
right supraclavicular region.  Right internal jugular catheter
noted on prior exam has been removed.  Minimal right apical
pneumothorax is noted which is not significantly changed compared
to prior exam.  Right lateral rib fracture is again noted.
IMPRESSION: No change in minimal right apical pneumothorax compared to prior
exam.  Right paratracheal prominence is again noted and unchanged
most likely representing postsurgical edema.

## 2013-09-17 ENCOUNTER — Ambulatory Visit (HOSPITAL_COMMUNITY)
Admission: RE | Admit: 2013-09-17 | Discharge: 2013-09-17 | Disposition: A | Discharge status: Home / Self Care | Payer: Medicare Other | Source: Ambulatory Visit | Attending: Internal Medicine | Admitting: Internal Medicine

## 2013-09-17 DIAGNOSIS — Z1231 Encounter for screening mammogram for malignant neoplasm of breast: Secondary | ICD-10-CM | POA: Insufficient documentation

## 2013-09-17 DIAGNOSIS — Z139 Encounter for screening, unspecified: Secondary | ICD-10-CM

## 2013-11-09 ENCOUNTER — Emergency Department (HOSPITAL_COMMUNITY): Payer: Medicare Other

## 2013-11-09 ENCOUNTER — Encounter (HOSPITAL_COMMUNITY): Payer: Self-pay | Admitting: Emergency Medicine

## 2013-11-09 ENCOUNTER — Emergency Department (HOSPITAL_COMMUNITY)
Admission: EM | Admit: 2013-11-09 | Discharge: 2013-11-10 | Disposition: A | Discharge status: Home / Self Care | Payer: Medicare Other | Attending: Emergency Medicine | Admitting: Emergency Medicine

## 2013-11-09 DIAGNOSIS — E039 Hypothyroidism, unspecified: Secondary | ICD-10-CM | POA: Insufficient documentation

## 2013-11-09 DIAGNOSIS — Z8742 Personal history of other diseases of the female genital tract: Secondary | ICD-10-CM | POA: Insufficient documentation

## 2013-11-09 DIAGNOSIS — T07XXXA Unspecified multiple injuries, initial encounter: Secondary | ICD-10-CM

## 2013-11-09 DIAGNOSIS — Z79899 Other long term (current) drug therapy: Secondary | ICD-10-CM | POA: Insufficient documentation

## 2013-11-09 DIAGNOSIS — Z87891 Personal history of nicotine dependence: Secondary | ICD-10-CM | POA: Insufficient documentation

## 2013-11-09 DIAGNOSIS — Y9389 Activity, other specified: Secondary | ICD-10-CM | POA: Insufficient documentation

## 2013-11-09 DIAGNOSIS — IMO0002 Reserved for concepts with insufficient information to code with codable children: Secondary | ICD-10-CM | POA: Insufficient documentation

## 2013-11-09 DIAGNOSIS — Z23 Encounter for immunization: Secondary | ICD-10-CM | POA: Insufficient documentation

## 2013-11-09 DIAGNOSIS — Z8679 Personal history of other diseases of the circulatory system: Secondary | ICD-10-CM | POA: Insufficient documentation

## 2013-11-09 DIAGNOSIS — J449 Chronic obstructive pulmonary disease, unspecified: Secondary | ICD-10-CM | POA: Insufficient documentation

## 2013-11-09 DIAGNOSIS — S40019A Contusion of unspecified shoulder, initial encounter: Secondary | ICD-10-CM | POA: Insufficient documentation

## 2013-11-09 DIAGNOSIS — S92309A Fracture of unspecified metatarsal bone(s), unspecified foot, initial encounter for closed fracture: Secondary | ICD-10-CM | POA: Insufficient documentation

## 2013-11-09 DIAGNOSIS — S99199A Other physeal fracture of unspecified metatarsal, initial encounter for closed fracture: Secondary | ICD-10-CM

## 2013-11-09 DIAGNOSIS — J4489 Other specified chronic obstructive pulmonary disease: Secondary | ICD-10-CM | POA: Insufficient documentation

## 2013-11-09 DIAGNOSIS — K219 Gastro-esophageal reflux disease without esophagitis: Secondary | ICD-10-CM | POA: Insufficient documentation

## 2013-11-09 DIAGNOSIS — S40029A Contusion of unspecified upper arm, initial encounter: Secondary | ICD-10-CM | POA: Insufficient documentation

## 2013-11-09 DIAGNOSIS — Z8739 Personal history of other diseases of the musculoskeletal system and connective tissue: Secondary | ICD-10-CM | POA: Insufficient documentation

## 2013-11-09 DIAGNOSIS — R011 Cardiac murmur, unspecified: Secondary | ICD-10-CM | POA: Insufficient documentation

## 2013-11-09 DIAGNOSIS — R296 Repeated falls: Secondary | ICD-10-CM | POA: Insufficient documentation

## 2013-11-09 DIAGNOSIS — S80211A Abrasion, right knee, initial encounter: Secondary | ICD-10-CM

## 2013-11-09 DIAGNOSIS — X500XXA Overexertion from strenuous movement or load, initial encounter: Secondary | ICD-10-CM | POA: Insufficient documentation

## 2013-11-09 DIAGNOSIS — Y929 Unspecified place or not applicable: Secondary | ICD-10-CM | POA: Insufficient documentation

## 2013-11-09 MED ORDER — TETANUS-DIPHTH-ACELL PERTUSSIS 5-2.5-18.5 LF-MCG/0.5 IM SUSP
0.5000 mL | Freq: Once | INTRAMUSCULAR | Status: AC
  Administered 2013-11-09: 0.5 mL via INTRAMUSCULAR
  Filled 2013-11-09: qty 0.5

## 2013-11-09 MED ORDER — HYDROCODONE-ACETAMINOPHEN 5-325 MG PO TABS: 1.0000 | ORAL_TABLET | Freq: Four times a day (QID) | ORAL | Status: DC | PRN

## 2013-11-09 MED ORDER — HYDROCODONE-ACETAMINOPHEN 5-325 MG PO TABS: 1.0000 | ORAL_TABLET | ORAL | Status: DC | PRN

## 2013-11-09 NOTE — ED Provider Notes (Signed)
CSN: [id number]     Arrival date & time 11/09/13  2032 History   None    Chief Complaint  Patient presents with  . Fall     (Consider location/radiation/quality/duration/timing/severity/associated sxs/prior Treatment) HPI This is a 79 year old female who fell just prior to arrival. She attributes this to her left foot falling asleep due to position she was again. When she fell she twisted her left ankle and foot and landed on her right shoulder and right knee. She is having mild to moderate pain in her right shoulder and humerus, worse with movement and certain positions. She is having mild pain in her right knee where there is an abrasion and ecchymosis. She is having moderate to severe pain in her left ankle and foot, primarily over the base of the left fifth metatarsal. There is associated swelling of the left lateral malleolus and ecchymosis over the base of the left fifth metatarsal. She denies head injury, neck pain or back pain.  Past Medical History  Diagnosis Date  . Urinary incontinence     "slight bit" (03/18/2012)  . Fibroid   . GERD (gastroesophageal reflux disease)   . Lesion of right lung   . Hypercholesteremia   . Hypothyroidism   . Post menopausal syndrome   . Burning mouth syndrome   . Plantar fascial fibromatosis   . COPD (chronic obstructive pulmonary disease) 02/12/2012    PFT 02/08/12>>FEV1 1.63 (95%), FEV1% 65, TLC 4.12 (94%), DLCO 74%, no BD   . H/O hiatal hernia   . Arthritis     hands   . Lung mass     R upper lobe   . Atrial fibrillation and flutter   . Heart murmur     02/2012 treadmill & echo, told that she has a "leak", but cleared for surgery    . History of chronic bronchitis     "controlled after GERD controlled" (03/18/2012)  . Bloody sputum     "started 12/2011" (03/18/2012)  . Ocular migraine     "I have the flashing lights" (03/18/2012)  . Vaginal atrophy 07/02/2013   Past Surgical History  Procedure Laterality Date  . Bladder suspension   2007    Anterior repair  . Colonoscopy    . Upper gastrointestinal endoscopy    . Eye surgery      implants in both eyes   . Video bronchoscopy  03/13/2012    Procedure: VIDEO BRONCHOSCOPY;  Surgeon: Gaye Pollack, MD;  Location: [facility];  Service: Thoracic;  Laterality: N/A;  . Thoracotomy  03/13/2012    Procedure: THORACOTOMY MAJOR;  Surgeon: Gaye Pollack, MD;  Location: [facility];  Service: Thoracic;  Laterality: Right;  Right Thoracotomy, Right Upper Lobectomy, and lymphnode sampling  . Lobectomy  03/13/2012    Procedure: LOBECTOMY;  Surgeon: Gaye Pollack, MD;  Location: [facility] OR;  Service: Thoracic;  Laterality: Right;  Right Thoracotomy, Right Upper Lobectomy, and lymphnode sampling  . Lymph node biopsy  03/13/2012    Procedure: LYMPH NODE BIOPSY;  Surgeon: Gaye Pollack, MD;  Location: [facility];  Service: Thoracic;  Laterality: Right;  Right Thoracotomy, Right Upper Lobectomy, and lymphnode sampling  . Abdominal hysterectomy  1980  . Cataract extraction w/ intraocular lens  implant, bilateral  1990's  . Cystocele repair  2007  . Skin cancer excision  05/2011    RUE "above elbow" (03/18/2012)   Family History  Problem Relation Age of Onset  . Uterine cancer Mother   . Heart  disease Mother   . Osteoporosis Mother   . Other Mother     abdominal aortic aneurysm/chronic lymphoid leukemia  . Pancreatic cancer Father     pancreatic  . Polycythemia Father   . Multiple sclerosis Daughter   . Cancer Maternal Aunt     Oral cancer   History  Substance Use Topics  . Smoking status: Former Smoker -- 2.00 packs/day for 10 years    Types: Cigarettes    Quit date: 06/03/1976  . Smokeless tobacco: Never Used  . Alcohol Use: No     Comment: occ   OB History   Grav Para Term Preterm Abortions TAB SAB Ect Mult Living   2 2 2       2      Review of Systems  All other systems reviewed and are negative.   Allergies  Codeine  Home Medications   Prior to Admission medications    Medication Sig Start Date End Date Taking? Authorizing Provider  CELEBREX 200 MG capsule Take 200 mg by mouth as needed. For pain 03/29/11  Yes Historical Provider, MD  esomeprazole (NEXIUM) 40 MG capsule Take 40 mg by mouth 2 (two) times daily.   Yes Historical Provider, MD  estradiol (VIVELLE-DOT) 0.05 MG/24HR patch Place 1 patch (0.05 mg total) onto the skin 2 (two) times a week. Sunday & Thursday 01/21/13  Yes Terrance Mass, MD  FIBER PO Take 2 Units by mouth at bedtime. 2 units=2 teaspoonfuls   Yes Historical Provider, MD  levothyroxine (SYNTHROID, LEVOTHROID) 75 MCG tablet Take 75 mcg by mouth daily before breakfast.    Yes Historical Provider, MD  loratadine (CLARITIN) 10 MG tablet Take 10 mg by mouth daily as needed.    Yes Historical Provider, MD  Misc Natural Products (TART CHERRY ADVANCED) CAPS Take 1 capsule by mouth 2 (two) times daily. Patient takes this Black Cherry 250 mg Capsule twice daily   Yes Historical Provider, MD  mometasone (NASONEX) 50 MCG/ACT nasal spray Place 2 sprays into the nose as needed. For allergies   Yes Historical Provider, MD  Multiple Vitamin (MULTIVITAMIN) capsule Take 1 capsule by mouth daily.    Yes Historical Provider, MD  Probiotic Product (TRUBIOTICS PO) Take 1 capsule by mouth daily.    Yes Historical Provider, MD  azithromycin (ZITHROMAX) 250 MG tablet Take 250-500 mg by mouth See admin instructions. Take tow tablets on day 1 then take one tablet on days 2 through 5    Historical Provider, MD   BP 150/75  Pulse 70  Temp(Src) 97.5 F (36.4 C) (Oral)  Resp 16  Ht 5\' 1"  (1.549 m)  Wt 160 lb (72.576 kg)  BMI 30.25 kg/m2  SpO2 99%  Physical Exam General: Well-developed, well-nourished female in no acute distress; appearance consistent with age of record HENT: normocephalic; atraumatic Eyes: pupils equal, round and reactive to light; extraocular muscles intact; lens implants Neck: supple Heart: regular rate and rhythm Lungs: clear to  auscultation bilaterally Chest: Nontender Abdomen: soft; nondistended; nontender Extremities: No deformity; pulses normal; superficial abrasion and ecchymosis of the right patella with mild tenderness; pain in right shoulder and right upper arm with movement but no point tenderness; swelling and tenderness of the left lateral malleolus; ecchymosis and tenderness at the base of the left fifth metatarsal Neurologic: Awake, alert and oriented; motor function intact in all extremities and symmetric; no facial droop Skin: Warm and dry Psychiatric: Normal mood and affect    ED Course  Procedures (including critical  care time)  MDM  Nursing notes and vitals signs, including pulse oximetry, reviewed.  Summary of this visit's results, reviewed by myself:  Labs:  No results found for this or any previous visit (from the past 24 hour(s)).  Imaging Studies: Dg Shoulder Right  11/09/2013   CLINICAL DATA:  Fall.  Proximal right humerus and shoulder pain.  EXAM: RIGHT SHOULDER - 2+ VIEW  COMPARISON:  None.  FINDINGS: Mild AC joint osteoarthritis. The glenohumeral joint appears located. Suboptimal axillary view. Proximal humerus appears intact. Scapula within normal limits.  IMPRESSION: Moderate right AC joint osteoarthritis. No acute osseous abnormality identified.   Electronically Signed   By: Dereck Ligas M.D.   On: 11/09/2013 22:22   Dg Ankle Complete Left  11/09/2013   CLINICAL DATA:  Status post fall; left ankle swelling.  EXAM: LEFT ANKLE COMPLETE - 3+ VIEW  COMPARISON:  None.  FINDINGS: There is a minimally displaced Jones fracture through the base of the fifth metatarsal. No additional fractures are seen. The ankle mortise is intact; the interosseous space is within normal limits. No talar tilt or subluxation is seen. Plantar and posterior calcaneal spurs are noted.  The joint spaces are preserved. Dorsal and mild lateral soft tissue swelling is noted.  IMPRESSION: Minimally displaced Jones  fracture through the base of the fifth metatarsal.   Electronically Signed   By: Garald Balding M.D.   On: 11/09/2013 22:23   Dg Knee Complete 4 Views Right  11/09/2013   CLINICAL DATA:  Status post fall; right knee abrasion.  EXAM: RIGHT KNEE - COMPLETE 4+ VIEW  COMPARISON:  None.  FINDINGS: There is no evidence of fracture or dislocation. The joint spaces are preserved. Mild cortical irregularity is noted along the articular surface of the patella.  No significant joint effusion is seen. The visualized soft tissues are normal in appearance.  IMPRESSION: No evidence of fracture or dislocation. Minimal degenerative change at the patellofemoral compartment.   Electronically Signed   By: Garald Balding M.D.   On: 11/09/2013 22:24   Dg Humerus Right  11/09/2013   CLINICAL DATA:  Fall.  Proximal right humerus and shoulder pain.  EXAM: RIGHT HUMERUS - 2+ VIEW  COMPARISON:  None.  FINDINGS: Right humerus intact. Grossly, the elbow appears within normal limits. Right AC joint osteoarthritis.  IMPRESSION: Intact right humerus.   Electronically Signed   By: Dereck Ligas M.D.   On: 11/09/2013 22:25        Wynetta Fines, MD 11/09/13 2319

## 2013-11-09 NOTE — ED Notes (Signed)
Patient states she was sitting down and her foot went to sleep.  Patient states she stood up and fell forward; states she landed on right shoulder, right upper arm, right elbow, right knee and left ankle.  Patient has abrasion noted to right knee and swelling noted to left ankle.

## 2013-11-18 ENCOUNTER — Encounter: Payer: Self-pay | Admitting: *Deleted

## 2013-11-30 MED FILL — Hydrocodone-Acetaminophen Tab 5-325 MG: ORAL | Qty: 6 | Status: AC

## 2014-01-18 ENCOUNTER — Encounter: Payer: Self-pay | Admitting: Obstetrics & Gynecology

## 2014-01-18 ENCOUNTER — Ambulatory Visit (INDEPENDENT_AMBULATORY_CARE_PROVIDER_SITE_OTHER): Payer: Medicare Other | Admitting: Obstetrics & Gynecology

## 2014-01-18 VITALS — BP 130/76 | Ht 61.0 in | Wt 160.0 lb

## 2014-01-18 DIAGNOSIS — Z124 Encounter for screening for malignant neoplasm of cervix: Secondary | ICD-10-CM

## 2014-01-18 DIAGNOSIS — N952 Postmenopausal atrophic vaginitis: Secondary | ICD-10-CM

## 2014-01-18 MED ORDER — ESTRADIOL 0.1 MG/GM VA CREA: TOPICAL_CREAM | VAGINAL | Status: DC

## 2014-01-18 MED ORDER — ESTRADIOL 0.05 MG/24HR TD PTTW: 1.0000 | MEDICATED_PATCH | TRANSDERMAL | Status: DC

## 2014-01-28 ENCOUNTER — Other Ambulatory Visit (HOSPITAL_COMMUNITY): Payer: Self-pay | Admitting: Internal Medicine

## 2014-01-28 DIAGNOSIS — Z139 Encounter for screening, unspecified: Secondary | ICD-10-CM

## 2014-01-29 ENCOUNTER — Other Ambulatory Visit (HOSPITAL_COMMUNITY): Payer: Medicare Other

## 2014-02-02 ENCOUNTER — Ambulatory Visit (HOSPITAL_COMMUNITY)
Admission: RE | Admit: 2014-02-02 | Discharge: 2014-02-02 | Disposition: A | Discharge status: Home / Self Care | Payer: Medicare Other | Source: Ambulatory Visit | Attending: Internal Medicine | Admitting: Internal Medicine

## 2014-02-02 DIAGNOSIS — Z139 Encounter for screening, unspecified: Secondary | ICD-10-CM | POA: Insufficient documentation

## 2014-02-23 NOTE — Progress Notes (Signed)
Patient ID: [name], female   DOB: 02-22-1935, 79 y.o.   MRN: [id number] Subjective:     [name] is a 79 y.o. female here for a routine exam.  No LMP recorded. Patient has had a hysterectomy. [id number] Birth Control Method:  na Menstrual Calendar(currently): amenorrheic  Current complaints: none.   Current acute medical issues:  none   Recent Gynecologic History No LMP recorded. Patient has had a hysterectomy. Last Pap: 201?,  normal Last mammogram: 2015,  normal  Past Medical History  Diagnosis Date  . Urinary incontinence     "slight bit" (03/18/2012)  . Fibroid   . GERD (gastroesophageal reflux disease)   . Lesion of right lung   . Hypercholesteremia   . Hypothyroidism   . Post menopausal syndrome   . Burning mouth syndrome   . Plantar fascial fibromatosis   . COPD (chronic obstructive pulmonary disease) 02/12/2012    PFT 02/08/12>>FEV1 1.63 (95%), FEV1% 65, TLC 4.12 (94%), DLCO 74%, no BD   . H/O hiatal hernia   . Arthritis     hands   . Lung mass     R upper lobe   . Atrial fibrillation and flutter   . Heart murmur     02/2012 treadmill & echo, told that she has a "leak", but cleared for surgery    . History of chronic bronchitis     "controlled after GERD controlled" (03/18/2012)  . Bloody sputum     "started 12/2011" (03/18/2012)  . Ocular migraine     "I have the flashing lights" (03/18/2012)  . Vaginal atrophy 07/02/2013    Past Surgical History  Procedure Laterality Date  . Bladder suspension  2007    Anterior repair  . Colonoscopy    . Upper gastrointestinal endoscopy    . Eye surgery      implants in both eyes   . Video bronchoscopy  03/13/2012    Procedure: VIDEO BRONCHOSCOPY;  Surgeon: Gaye Pollack, MD;  Location: [place];  Service: Thoracic;  Laterality: N/A;  . Thoracotomy  03/13/2012    Procedure: THORACOTOMY MAJOR;  Surgeon: Gaye Pollack, MD;  Location: [facility] OR;  Service: Thoracic;  Laterality: Right;  Right Thoracotomy, Right Upper  Lobectomy, and lymphnode sampling  . Lobectomy  03/13/2012    Procedure: LOBECTOMY;  Surgeon: Gaye Pollack, MD;  [facility]: [facility] OR;  Service: Thoracic;  Laterality: Right;  Right Thoracotomy, Right Upper Lobectomy, and lymphnode sampling  . Lymph node biopsy  03/13/2012    Procedure: LYMPH NODE BIOPSY;  Surgeon: Gaye Pollack, MD;  Location: [facility] OR;  Service: Thoracic;  Laterality: Right;  Right Thoracotomy, Right Upper Lobectomy, and lymphnode sampling  . Abdominal hysterectomy  1980  . Cataract extraction w/ intraocular lens  implant, bilateral  1990's  . Cystocele repair  2007  . Skin cancer excision  05/2011    RUE "above elbow" (03/18/2012)    OB History   Grav Para Term Preterm Abortions TAB SAB Ect Mult Living   2 2 2       2       History   Social History  . Marital Status: Married    Spouse Name: N/A    Number of Children: N/A  . Years of Education: N/A   Occupational History  . retired    Social History Main Topics  . Smoking status: Former Smoker -- 2.00 packs/day for 10 years    Types: Cigarettes    Quit date:  06/03/1976  . Smokeless tobacco: Never Used  . Alcohol Use: No     Comment: occ  . Drug Use: No  . Sexual Activity: No   Other Topics Concern  . None   Social History Narrative  . None    Family History  Problem Relation Age of Onset  . Uterine cancer Mother   . Heart disease Mother   . Osteoporosis Mother   . Other Mother     abdominal aortic aneurysm/chronic lymphoid leukemia  . Pancreatic cancer Father     pancreatic  . Polycythemia Father   . Multiple sclerosis Daughter   . Cancer Maternal Aunt     Oral cancer     Review of Systems  Review of Systems  Constitutional: Negative for fever, chills, weight loss, malaise/fatigue and diaphoresis.  HENT: Negative for hearing loss, ear pain, nosebleeds, congestion, sore throat, neck pain, tinnitus and ear discharge.   Eyes: Negative for blurred vision, double vision, photophobia, pain,  discharge and redness.  Respiratory: Negative for cough, hemoptysis, sputum production, shortness of breath, wheezing and stridor.   Cardiovascular: Negative for chest pain, palpitations, orthopnea, claudication, leg swelling and PND.  Gastrointestinal: negative for abdominal pain. Negative for heartburn, nausea, vomiting, diarrhea, constipation, blood in stool and melena.  Genitourinary: Negative for dysuria, urgency, frequency, hematuria and flank pain.  Musculoskeletal: Negative for myalgias, back pain, joint pain and falls.  Skin: Negative for itching and rash.  Neurological: Negative for dizziness, tingling, tremors, sensory change, speech change, focal weakness, seizures, loss of consciousness, weakness and headaches.  Endo/Heme/Allergies: Negative for environmental allergies and polydipsia. Does not bruise/bleed easily.  Psychiatric/Behavioral: Negative for depression, suicidal ideas, hallucinations, memory loss and substance abuse. The patient is not nervous/anxious and does not have insomnia.        Objective:    Vagina is pink moist without discharge Cervix absent Uterus is absent Adnexa is negative Rectal    hemoccult negative, normal tone, no masses  Musculoskeletal: Normal range of motion. She exhibits no edema and no tenderness.  Neurological: She is alert and oriented to person, place, and time. She has normal reflexes. She displays normal reflexes. No cranial nerve deficit. She exhibits normal muscle tone. Coordination normal.  Skin: Skin is warm and dry. No rash noted. No erythema. No pallor.  Psychiatric: She has a normal mood and affect. Her behavior is normal. Judgment and thought content normal.       Assessment:    Healthy female exam.    Plan:    Follow up in: 3 years.

## 2014-03-12 ENCOUNTER — Other Ambulatory Visit: Payer: Self-pay

## 2014-03-29 ENCOUNTER — Encounter: Payer: Self-pay | Admitting: Obstetrics & Gynecology

## 2014-04-29 ENCOUNTER — Other Ambulatory Visit (INDEPENDENT_AMBULATORY_CARE_PROVIDER_SITE_OTHER): Payer: Self-pay | Admitting: Internal Medicine

## 2014-08-24 ENCOUNTER — Telehealth: Payer: Self-pay | Admitting: *Deleted

## 2014-08-24 NOTE — Telephone Encounter (Signed)
Called pt insurance for PA, informed pending for further review. Will be contacted by insurance whether approved or needed further information, PA # [id number]. Pt aware.

## 2014-09-07 ENCOUNTER — Other Ambulatory Visit (HOSPITAL_COMMUNITY): Payer: Self-pay | Admitting: Internal Medicine

## 2014-09-07 DIAGNOSIS — Z1231 Encounter for screening mammogram for malignant neoplasm of breast: Secondary | ICD-10-CM

## 2014-10-07 ENCOUNTER — Ambulatory Visit (HOSPITAL_COMMUNITY)
Admission: RE | Admit: 2014-10-07 | Discharge: 2014-10-07 | Disposition: A | Discharge status: Home / Self Care | Payer: Medicare Other | Source: Ambulatory Visit | Attending: Internal Medicine | Admitting: Internal Medicine

## 2014-10-07 DIAGNOSIS — Z1231 Encounter for screening mammogram for malignant neoplasm of breast: Secondary | ICD-10-CM | POA: Diagnosis not present

## 2014-11-22 ENCOUNTER — Other Ambulatory Visit: Payer: Self-pay

## 2015-01-03 ENCOUNTER — Ambulatory Visit (INDEPENDENT_AMBULATORY_CARE_PROVIDER_SITE_OTHER): Payer: Medicare Other | Admitting: Adult Health

## 2015-01-03 ENCOUNTER — Encounter: Payer: Self-pay | Admitting: Adult Health

## 2015-01-03 VITALS — BP 120/60 | HR 72 | Ht 60.5 in | Wt 157.5 lb

## 2015-01-03 DIAGNOSIS — N952 Postmenopausal atrophic vaginitis: Secondary | ICD-10-CM

## 2015-01-03 DIAGNOSIS — Z79899 Other long term (current) drug therapy: Secondary | ICD-10-CM

## 2015-01-03 DIAGNOSIS — Z79818 Long term (current) use of other agents affecting estrogen receptors and estrogen levels: Secondary | ICD-10-CM

## 2015-01-03 HISTORY — DX: Other long term (current) drug therapy: Z79.899

## 2015-01-03 HISTORY — DX: Long term (current) use of other agents affecting estrogen receptors and estrogen levels: Z79.818

## 2015-01-03 MED ORDER — ESTRADIOL 0.05 MG/24HR TD PTTW: 1.0000 | MEDICATED_PATCH | TRANSDERMAL | Status: DC

## 2015-01-03 MED ORDER — ESTRADIOL 0.05 MG/24HR TD PTWK: 0.0500 mg | MEDICATED_PATCH | TRANSDERMAL | Status: DC

## 2015-01-03 NOTE — Progress Notes (Signed)
Subjective:     Patient ID: [name], female   DOB: [birth year], 80 y.o.   MRN: [id number]  HPI [name] is a 80 year old white female, married in complaining of hot flashes if does not use patch and they are expensive, and insurance won't pay and wants to be check to see if cystocele returned.Has been on ET for 30 years and wants to continue.  Review of Systems Patient denies any headaches, hearing loss, fatigue, blurred vision, shortness of breath, chest pain, abdominal pain, problems with bowel movements, or intercourse(not having sex). No joint pain or mood swings.See HPI for positives. Reviewed past medical,surgical, social and family history. Reviewed medications and allergies.     Objective:   Physical Exam BP 120/60 mmHg  Pulse 72  Ht 5' 0.5" (1.537 m)  Wt 157 lb 8 oz (71.442 kg)  BMI 30.24 kg/m2   Skin warm and dry.Pelvic: external genitalia is normal in appearance no lesions, vagina: is thin with decreased color, moisture and rugae, there is area of redness left inner labia, urethra has no lesions or masses noted,but has redness at opening, cervix and uterus are absent, no masses or tenderness noted, adnexa: no masses or tenderness noted. Bladder is non tender and no masses felt. No recurrent cystocele.Dr Glo Herring in for co exam and agrees that areas of redness from rubbing.  Assessment:     Vaginal atrophy Estrogen therapy    Plan:     Rx estradiol patch 0.05 mg use 1 2 x weekly with 12 refills Use premarin vaginal cream daily for about a week then 2-3 x weekly  Follow up in 6 weeks

## 2015-01-03 NOTE — Patient Instructions (Signed)
Use cream daily for about a week then 2-3 x week follow up in 6 weeks

## 2015-01-27 ENCOUNTER — Other Ambulatory Visit: Payer: Self-pay | Admitting: Orthopedic Surgery

## 2015-01-27 DIAGNOSIS — M25562 Pain in left knee: Secondary | ICD-10-CM

## 2015-02-11 ENCOUNTER — Inpatient Hospital Stay: Admission: RE | Admit: 2015-02-11 | Payer: Medicare Other | Source: Ambulatory Visit

## 2015-02-14 ENCOUNTER — Encounter: Payer: Self-pay | Admitting: Adult Health

## 2015-02-14 ENCOUNTER — Ambulatory Visit (INDEPENDENT_AMBULATORY_CARE_PROVIDER_SITE_OTHER): Payer: Medicare Other | Admitting: Adult Health

## 2015-02-14 ENCOUNTER — Ambulatory Visit
Admission: RE | Admit: 2015-02-14 | Discharge: 2015-02-14 | Disposition: A | Discharge status: Home / Self Care | Payer: PRIVATE HEALTH INSURANCE | Source: Ambulatory Visit | Attending: Orthopedic Surgery | Admitting: Orthopedic Surgery

## 2015-02-14 VITALS — BP 130/62 | HR 76 | Ht 60.5 in | Wt 156.0 lb

## 2015-02-14 DIAGNOSIS — N898 Other specified noninflammatory disorders of vagina: Secondary | ICD-10-CM | POA: Diagnosis not present

## 2015-02-14 DIAGNOSIS — N952 Postmenopausal atrophic vaginitis: Secondary | ICD-10-CM

## 2015-02-14 DIAGNOSIS — M25562 Pain in left knee: Secondary | ICD-10-CM

## 2015-02-14 HISTORY — DX: Other specified noninflammatory disorders of vagina: N89.8

## 2015-02-14 LAB — POCT WET PREP (WET MOUNT): WBC, Wet Prep HPF POC: POSITIVE

## 2015-02-14 MED ORDER — METRONIDAZOLE 0.75 % VA GEL: VAGINAL | Status: DC

## 2015-02-14 NOTE — Patient Instructions (Signed)
Use metrogel at bedtime for 7 night Follow up prn

## 2015-02-14 NOTE — Progress Notes (Signed)
Subjective:     Patient ID: [name], female   DOB: 06-Apr-1935, 80 y.o.   MRN: [id number]  HPI [name] is a 80 year old white female, married, in complaining of vaginal discharge with odor.  Review of Systems Patient denies any headaches, hearing loss, fatigue, blurred vision, shortness of breath, chest pain, abdominal pain, problems with bowel movements, urination, or intercourse. No joint pain or mood swings.+vaginal discharge with odor.  Reviewed past medical,surgical, social and family history. Reviewed medications and allergies.     Objective:   Physical Exam BP 130/62 mmHg  Pulse 76  Ht 5' 0.5" (1.537 m)  Wt 156 lb (70.761 kg)  BMI 29.95 kg/m2   Skin warm and dry.Pelvic: external genitalia is normal in appearance no lesions, vagina: thin, pale, and atrophic with light yellowish discharge, has some strawberry appearance on side walls, no odor,urethra has no lesions or masses noted, cervix and uterus are absent. adnexa: no masses or tenderness noted. Bladder is non tender and no masses felt. Wet prep: + for few clue cells and +WBCs.Will try metrogel to balance vaginal PH.  Assessment:     Vaginal discharge Vaginal atrophy     Plan:     Rx metrogel use 1 applicator in vagina at bedtime for 7 night Follow up prn

## 2015-04-14 ENCOUNTER — Ambulatory Visit (INDEPENDENT_AMBULATORY_CARE_PROVIDER_SITE_OTHER): Payer: Medicare Other | Admitting: Internal Medicine

## 2015-04-14 ENCOUNTER — Encounter (INDEPENDENT_AMBULATORY_CARE_PROVIDER_SITE_OTHER): Payer: Self-pay | Admitting: Internal Medicine

## 2015-04-14 VITALS — BP 144/74 | HR 65 | Temp 97.7°F | Ht 60.5 in | Wt 156.0 lb

## 2015-04-14 DIAGNOSIS — K921 Melena: Secondary | ICD-10-CM | POA: Diagnosis not present

## 2015-04-14 NOTE — Progress Notes (Signed)
Subjective:    Patient ID: [name], female    DOB: [birth year], 80 y.o.   MRN: [id number]  HPI Here today with c/o dark stools. She presents today with c/o tarry black stools. One stool was in September and the other black stool was in October.  She says years ago she had a couple of black stools that was caused by an antibiotic. She tells me she was Motrin 800mg  four times a day in July. She took the Motrin x 2 months. She stopped the Motrin because her hair began to fall out.  She was taking Motrin for knee pain.  Appetite is good. No weight loss. No abdominal pain. She usually has a BM 1-2 a day. Brown in color now.   01/27/2015 H and H 14.4 and 43.1, MCV 98, Platelet cdt 208 Total bili 0.6, ALP 53, AST 13, ALT 10.    02/04/2012 EGD & Colonoscopy.  Indications: Patient is 80 year old Caucasian female with chronic GERD and complains of back taste in her throat and also is noted blood tinged throat secretions. She has been evaluated by ENT specialist in no source found. He also has undergone evaluation by pulmonologist and found to have right upper lobe lung nodule and PET scan is pending. She has not seen any blood in the last 2 weeks. She is undergoing diagnostic EGD and screening colonoscopy.  Impression:  No lesion found to account for patient's blood-tinged throat secretions. No mucosal abnormality to hypopharyngeal mucosa. No evidence of erosive esophagitis or peptic ulcer disease. 3 small islands of ectopic gastric mucosa at proximal esophagus below UES. Multiple small hyperplastic polyps at gastric body and fundus. Colonoscopy completed to cecum. Two small polyps ablated via cold biopsy and submitted together. These are located at ascending colon and hepatic flexure. 6 mm polyp snared from proximal transverse colon. Small rectal polyps coagulated using snare tip.  Both  polyps are tubular adenomas. Results reviewed with patient tells me her PET scan was abnormal and she would be having surgery for small lesion of right upper lobe in near future. Next colonoscopy in 5 years. Report to Dr. Willey Blade    Review of Systems Past Medical History  Diagnosis Date  . Urinary incontinence     "slight bit" (03/18/2012)  . Fibroid   . GERD (gastroesophageal reflux disease)   . Lesion of right lung   . Hypercholesteremia   . Hypothyroidism   . Post menopausal syndrome   . Burning mouth syndrome   . Plantar fascial fibromatosis   . COPD (chronic obstructive pulmonary disease) ([place]) 02/12/2012    PFT 02/08/12>>FEV1 1.63 (95%), FEV1% 65, TLC 4.12 (94%), DLCO 74%, no BD   . H/O hiatal hernia   . Arthritis     hands   . Lung mass     R upper lobe   . Atrial fibrillation and flutter   . Heart murmur     02/2012 treadmill & echo, told that she has a "leak", but cleared for surgery    . History of chronic bronchitis     "controlled after GERD controlled" (03/18/2012)  . Bloody sputum     "started 12/2011" (03/18/2012)  . Ocular migraine     "I have the flashing lights" (03/18/2012)  . Vaginal atrophy 07/02/2013  . Current use of estrogen therapy 01/03/2015  . Knee pain   . Vaginal discharge 02/14/2015    Past Surgical History  Procedure Laterality Date  . Bladder suspension  2007  Anterior repair  . Colonoscopy    . Upper gastrointestinal endoscopy    . Eye surgery      implants in both eyes   . Video bronchoscopy  03/13/2012    Procedure: VIDEO BRONCHOSCOPY;  Surgeon: Gaye Pollack, MD;  Location: [place];  Service: Thoracic;  Laterality: N/A;  . Thoracotomy  03/13/2012    Procedure: THORACOTOMY MAJOR;  Surgeon: Gaye Pollack, MD;  Location: [facility] OR;  Service: Thoracic;  Laterality: Right;  Right Thoracotomy, Right Upper Lobectomy, and lymphnode sampling  . Lobectomy  03/13/2012    Procedure: LOBECTOMY;  Surgeon: Gaye Pollack, MD;  Location: [facility] OR;  Service:  Thoracic;  Laterality: Right;  Right Thoracotomy, Right Upper Lobectomy, and lymphnode sampling  . Lymph node biopsy  03/13/2012    Procedure: LYMPH NODE BIOPSY;  Surgeon: Gaye Pollack, MD;  Location: [facility] OR;  Service: Thoracic;  Laterality: Right;  Right Thoracotomy, Right Upper Lobectomy, and lymphnode sampling  . Abdominal hysterectomy  1980  . Cataract extraction w/ intraocular lens  implant, bilateral  1990's  . Cystocele repair  2007  . Skin cancer excision  05/2011    RUE "above elbow" (03/18/2012)    Allergies  Allergen Reactions  . Codeine Nausea And Vomiting  . Other Swelling and Other (See Comments)    Muscle Relaxers-weight gain    Current Outpatient Prescriptions on File Prior to Visit  Medication Sig Dispense Refill  . acetaminophen (TYLENOL) 500 MG tablet Take 500 mg by mouth as needed.    . cetirizine (ZYRTEC) 10 MG tablet Take 10 mg by mouth daily.    [name] esomeprazole (NEXIUM) 40 MG capsule Take 40 mg by mouth daily.     [name] estradiol (VIVELLE-DOT) 0.05 MG/24HR patch Place 1 patch (0.05 mg total) onto the skin 2 (two) times a week. 8 patch 12  . FIBER PO Take 2 Units by mouth at bedtime. 2 units=2 teaspoonfuls    . levothyroxine (SYNTHROID, LEVOTHROID) 75 MCG tablet Take 75 mcg by mouth daily before breakfast.     . Misc Natural Products (TART CHERRY ADVANCED) CAPS Take 1 capsule by mouth 2 (two) times daily. Patient takes this Black Cherry 250 mg Capsule twice daily    . mometasone (NASONEX) 50 MCG/ACT nasal spray Place 2 sprays into the nose as needed. For allergies    . Multiple Vitamin (MULTIVITAMIN) capsule Take 1 capsule by mouth daily.     . metroNIDAZOLE (METROGEL VAGINAL) 0.75 % vaginal gel Use 1 applicator in vagina at bedtime for 7 night (Patient not taking: Reported on 04/14/2015) 70 g 0  . Probiotic Product (TRUBIOTICS PO) Take 1 capsule by mouth daily.      No current facility-administered medications on file prior to visit.        Objective:   Physical  ExamBlood pressure 144/74, pulse 65, temperature 97.7 F (36.5 C), height 5' 0.5" (1.537 m), weight 156 lb (70.761 kg). Alert and oriented. Skin warm and dry. Oral mucosa is moist.   . Sclera anicteric, conjunctivae is pink. Thyroid not enlarged. No cervical lymphadenopathy. Lungs clear. Heart regular rate and rhythm.  Abdomen is soft. Bowel sounds are positive. No hepatomegaly. No abdominal masses felt. No tenderness.  No edema to lower extremities. Rectal exam: no stool guaiac negative.     Lot [id number] Ex 9/17     Assessment & Plan:  Melena. She had been on an excessive amt of Motrin in July and August.  CBC today. If she  see any further melena she will call our office immediately.

## 2015-04-14 NOTE — Patient Instructions (Signed)
CBC today. No NSAIDS. If any further melena, please call our office immediately.

## 2015-04-15 ENCOUNTER — Encounter (INDEPENDENT_AMBULATORY_CARE_PROVIDER_SITE_OTHER): Payer: Self-pay

## 2015-04-15 LAB — CBC WITH DIFFERENTIAL/PLATELET
Basophils Absolute: 0.1 10*3/uL (ref 0.0–0.1)
Basophils Relative: 1 % (ref 0–1)
EOS PCT: 2 % (ref 0–5)
Eosinophils Absolute: 0.1 10*3/uL (ref 0.0–0.7)
HCT: 43.9 % (ref 36.0–46.0)
Hemoglobin: 15.1 g/dL — ABNORMAL HIGH (ref 12.0–15.0)
LYMPHS ABS: 2.4 10*3/uL (ref 0.7–4.0)
Lymphocytes Relative: 33 % (ref 12–46)
MCH: 32.9 pg (ref 26.0–34.0)
MCHC: 34.4 g/dL (ref 30.0–36.0)
MCV: 95.6 fL (ref 78.0–100.0)
MPV: 11 fL (ref 8.6–12.4)
Monocytes Absolute: 0.6 10*3/uL (ref 0.1–1.0)
Monocytes Relative: 9 % (ref 3–12)
Neutro Abs: 4 10*3/uL (ref 1.7–7.7)
Neutrophils Relative %: 55 % (ref 43–77)
PLATELETS: 252 10*3/uL (ref 150–400)
RBC: 4.59 MIL/uL (ref 3.87–5.11)
RDW: 12.8 % (ref 11.5–15.5)
WBC: 7.2 10*3/uL (ref 4.0–10.5)

## 2015-04-18 ENCOUNTER — Telehealth (INDEPENDENT_AMBULATORY_CARE_PROVIDER_SITE_OTHER): Payer: Self-pay | Admitting: *Deleted

## 2015-04-18 NOTE — Telephone Encounter (Signed)
   Diagnosis:    Result(s)   Card 1: Negative:     Card 2:Negative:   Card 3: Negative:    Completed by: Thomas Hoff, LPN   HEMOCCULT SENSA DEVELOPER: LOT#:  [id number] EXPIRATION DATE: 9-17   HEMOCCULT SENSA CARD:  LOT#:  02/14 EXPIRATION DATE: 07/18   CARD CONTROL RESULTS:  POSITIVE: Positive NEGATIVE: negative    ADDITIONAL COMMENTS: Forwarded to Terri to review.

## 2015-04-18 NOTE — Telephone Encounter (Signed)
Results given to patient. OV in February. All cards were negative.

## 2015-04-30 ENCOUNTER — Other Ambulatory Visit (INDEPENDENT_AMBULATORY_CARE_PROVIDER_SITE_OTHER): Payer: Self-pay | Admitting: Internal Medicine

## 2015-05-25 ENCOUNTER — Ambulatory Visit (INDEPENDENT_AMBULATORY_CARE_PROVIDER_SITE_OTHER): Payer: Medicare Other | Admitting: Internal Medicine

## 2015-05-25 ENCOUNTER — Encounter (INDEPENDENT_AMBULATORY_CARE_PROVIDER_SITE_OTHER): Payer: Self-pay | Admitting: Internal Medicine

## 2015-05-25 VITALS — BP 122/70 | HR 72 | Temp 97.7°F | Ht 60.5 in | Wt 153.9 lb

## 2015-05-25 DIAGNOSIS — K921 Melena: Secondary | ICD-10-CM | POA: Diagnosis not present

## 2015-05-25 LAB — CBC WITH DIFFERENTIAL/PLATELET
BASOS PCT: 1 % (ref 0–1)
Basophils Absolute: 0.1 10*3/uL (ref 0.0–0.1)
EOS ABS: 0.2 10*3/uL (ref 0.0–0.7)
EOS PCT: 3 % (ref 0–5)
HCT: 44.9 % (ref 36.0–46.0)
Hemoglobin: 15.5 g/dL — ABNORMAL HIGH (ref 12.0–15.0)
LYMPHS ABS: 2 10*3/uL (ref 0.7–4.0)
Lymphocytes Relative: 30 % (ref 12–46)
MCH: 32.4 pg (ref 26.0–34.0)
MCHC: 34.5 g/dL (ref 30.0–36.0)
MCV: 93.7 fL (ref 78.0–100.0)
MONOS PCT: 9 % (ref 3–12)
MPV: 10.2 fL (ref 8.6–12.4)
Monocytes Absolute: 0.6 10*3/uL (ref 0.1–1.0)
Neutro Abs: 3.8 10*3/uL (ref 1.7–7.7)
Neutrophils Relative %: 57 % (ref 43–77)
Platelets: 257 10*3/uL (ref 150–400)
RBC: 4.79 MIL/uL (ref 3.87–5.11)
RDW: 12.8 % (ref 11.5–15.5)
WBC: 6.7 10*3/uL (ref 4.0–10.5)

## 2015-05-25 NOTE — Progress Notes (Signed)
Subjective:    Patient ID: [name], female    DOB: 07/02/1934, 80 y.o.   MRN: [id number]  HPI Here today for f/u. She was last seen 04/14/2015 with c/o dark stools. She says her stools were tarry and black. She had one stool in September that was black and the other was in September.  She had been taking Motrin 800mg  four times a day in July for knee pain.  She has not taken any Motrin since August. Three stools cards were sent home with her and all three were negative. Her hemoglobin when I checked it was normal.  She tells me 03/20/2015 she had a black stool.  She had one black stool.  She has not been on Motrin since August. She tells me she had been on a Z pack a week before Christmas for bronchitis.  Appetite is good. No weight loss. She says her stools have been normal since 03/20/2015. No acid reflux. No abdominal pain.   CBC    Component Value Date/Time   WBC 7.2 04/14/2015 1500   RBC 4.59 04/14/2015 1500   HGB 15.1* 04/14/2015 1500   HCT 43.9 04/14/2015 1500   PLT 252 04/14/2015 1500   MCV 95.6 04/14/2015 1500   MCH 32.9 04/14/2015 1500   MCHC 34.4 04/14/2015 1500   RDW 12.8 04/14/2015 1500   LYMPHSABS 2.4 04/14/2015 1500   MONOABS 0.6 04/14/2015 1500   EOSABS 0.1 04/14/2015 1500   BASOSABS 0.1 04/14/2015 1500        01/27/2015 H and H 14.4 and 43.1, MCV 98, Platelet cdt 208 Total bili 0.6, ALP 53, AST 13, ALT 10.       02/04/2012 EGD & Colonoscopy.  Indications: Patient is 80 year old Caucasian female with chronic GERD and complains of back taste in her throat and also is noted blood tinged throat secretions. She has been evaluated by ENT specialist in no source found. He also has undergone evaluation by pulmonologist and found to have right upper lobe lung nodule and PET scan is pending. She has not seen any blood in the last 2 weeks. She is undergoing diagnostic EGD and screening  colonoscopy.  Impression:  No lesion found to account for patient's blood-tinged throat secretions. No mucosal abnormality to hypopharyngeal mucosa. No evidence of erosive esophagitis or peptic ulcer disease. 3 small islands of ectopic gastric mucosa at proximal esophagus below UES. Multiple small hyperplastic polyps at gastric body and fundus. Colonoscopy completed to cecum. Two small polyps ablated via cold biopsy and submitted together. These are located at ascending colon and hepatic flexure. 6 mm polyp snared from proximal transverse colon. Small rectal polyps coagulated using snare tip.  Both polyps are tubular adenomas. Results reviewed with patient tells me her PET scan was abnormal and she would be having surgery for small lesion of right upper lobe in near future. Next colonoscopy in 5 years. Report to Dr. Willey Blade  Review of Systems     Past Medical History  Diagnosis Date  . Urinary incontinence     "slight bit" (03/18/2012)  . Fibroid   . GERD (gastroesophageal reflux disease)   . Lesion of right lung   . Hypercholesteremia   . Hypothyroidism   . Post menopausal syndrome   . Burning mouth syndrome   . Plantar fascial fibromatosis   . COPD (chronic obstructive pulmonary disease) ([place]) 02/12/2012    PFT 02/08/12>>FEV1 1.63 (95%), FEV1% 65, TLC 4.12 (94%), DLCO 74%, no BD   . H/O hiatal  hernia   . Arthritis     hands   . Lung mass     R upper lobe   . Atrial fibrillation and flutter   . Heart murmur     02/2012 treadmill & echo, told that she has a "leak", but cleared for surgery    . History of chronic bronchitis     "controlled after GERD controlled" (03/18/2012)  . Bloody sputum     "started 12/2011" (03/18/2012)  . Ocular migraine     "I have the flashing lights" (03/18/2012)  . Vaginal atrophy 07/02/2013  . Current use of estrogen therapy 01/03/2015  . Knee  pain   . Vaginal discharge 02/14/2015    Past Surgical History  Procedure Laterality Date  . Bladder suspension  2007    Anterior repair  . Colonoscopy    . Upper gastrointestinal endoscopy    . Eye surgery      implants in both eyes   . Video bronchoscopy  03/13/2012    Procedure: VIDEO BRONCHOSCOPY;  Surgeon: Gaye Pollack, MD;  Location: [place];  Service: Thoracic;  Laterality: N/A;  . Thoracotomy  03/13/2012    Procedure: THORACOTOMY MAJOR;  Surgeon: Gaye Pollack, MD;  Location: [facility] OR;  Service: Thoracic;  Laterality: Right;  Right Thoracotomy, Right Upper Lobectomy, and lymphnode sampling  . Lobectomy  03/13/2012    Procedure: LOBECTOMY;  Surgeon: Gaye Pollack, MD;  Location: [facility] OR;  Service: Thoracic;  Laterality: Right;  Right Thoracotomy, Right Upper Lobectomy, and lymphnode sampling  . Lymph node biopsy  03/13/2012    Procedure: LYMPH NODE BIOPSY;  Surgeon: Gaye Pollack, MD;  Location: [facility] OR;  Service: Thoracic;  Laterality: Right;  Right Thoracotomy, Right Upper Lobectomy, and lymphnode sampling  . Abdominal hysterectomy  1980  . Cataract extraction w/ intraocular lens  implant, bilateral  1990's  . Cystocele repair  2007  . Skin cancer excision  05/2011    RUE "above elbow" (03/18/2012)    Allergies  Allergen Reactions  . Codeine Nausea And Vomiting  . Other Swelling and Other (See Comments)    Muscle Relaxers-weight gain    Current Outpatient Prescriptions on File Prior to Visit  Medication Sig Dispense Refill  . acetaminophen (TYLENOL) 500 MG tablet Take 500 mg by mouth as needed.    . cetirizine (ZYRTEC) 10 MG tablet Take 10 mg by mouth daily.    [name] esomeprazole (NEXIUM) 40 MG capsule Take 40 mg by mouth daily.     [name] estradiol (VIVELLE-DOT) 0.05 MG/24HR patch Place 1 patch (0.05 mg total) onto the skin 2 (two) times a week. 8 patch 12  . FIBER PO Take 2 Units by mouth at bedtime. 2 units=2 teaspoonfuls    . levothyroxine (SYNTHROID, LEVOTHROID) 75 MCG tablet  Take 75 mcg by mouth daily before breakfast.     . Misc Natural Products (TART CHERRY ADVANCED) CAPS Take 1 capsule by mouth 2 (two) times daily. Patient takes this Black Cherry 250 mg Capsule twice daily    . mometasone (NASONEX) 50 MCG/ACT nasal spray Place 2 sprays into the nose as needed. For allergies    . Multiple Vitamin (MULTIVITAMIN) capsule Take 1 capsule by mouth daily.     [name] NEXIUM 40 MG capsule TAKE ONE (1) CAPSULE BY MOUTH TWICE A DAY. (EVERY 12 HOURS.) 60 capsule 4  . Probiotic Product (TRUBIOTICS PO) Take 1 capsule by mouth daily.     . traMADol (ULTRAM) 50 MG tablet Take  by mouth every 6 (six) hours as needed.    . metroNIDAZOLE (METROGEL VAGINAL) 0.75 % vaginal gel Use 1 applicator in vagina at bedtime for 7 night (Patient not taking: Reported on 04/14/2015) 70 g 0   No current facility-administered medications on file prior to visit.     Objective:   Physical Exam Blood pressure 122/70, pulse 72, temperature 97.7 F (36.5 C), height 5' 0.5" (1.537 m), weight 153 lb 14.4 oz (69.809 kg). Alert and oriented. Skin warm and dry. Oral mucosa is moist.   . Sclera anicteric, conjunctivae is pink. Thyroid not enlarged. No cervical lymphadenopathy. Lungs clear. Heart regular rate and rhythm.  Abdomen is soft. Bowel sounds are positive. No hepatomegaly. No abdominal masses felt. No tenderness.  No edema to lower extremities. Stool brown and guaiac negative.       Assessment & Plan:  Melena. Am going to get a CBC today. I am going to send a stool card home with her. If she has another black stool she will collect the specimen.

## 2015-05-25 NOTE — Patient Instructions (Signed)
Cbc. One stool card home with patient when she has a black stool.

## 2015-07-19 ENCOUNTER — Ambulatory Visit (INDEPENDENT_AMBULATORY_CARE_PROVIDER_SITE_OTHER): Payer: PRIVATE HEALTH INSURANCE | Admitting: Internal Medicine

## 2015-10-10 ENCOUNTER — Other Ambulatory Visit (HOSPITAL_COMMUNITY): Payer: Self-pay | Admitting: Internal Medicine

## 2015-10-10 DIAGNOSIS — Z1231 Encounter for screening mammogram for malignant neoplasm of breast: Secondary | ICD-10-CM

## 2015-10-20 ENCOUNTER — Ambulatory Visit (HOSPITAL_COMMUNITY)
Admission: RE | Admit: 2015-10-20 | Discharge: 2015-10-20 | Disposition: A | Discharge status: Home / Self Care | Payer: Medicare Other | Source: Ambulatory Visit | Attending: Internal Medicine | Admitting: Internal Medicine

## 2015-10-20 DIAGNOSIS — Z1231 Encounter for screening mammogram for malignant neoplasm of breast: Secondary | ICD-10-CM | POA: Insufficient documentation

## 2015-10-26 ENCOUNTER — Other Ambulatory Visit (INDEPENDENT_AMBULATORY_CARE_PROVIDER_SITE_OTHER): Payer: Self-pay | Admitting: Internal Medicine

## 2015-10-27 ENCOUNTER — Other Ambulatory Visit (INDEPENDENT_AMBULATORY_CARE_PROVIDER_SITE_OTHER): Payer: Self-pay | Admitting: Internal Medicine

## 2016-01-25 ENCOUNTER — Other Ambulatory Visit: Payer: Self-pay | Admitting: Adult Health

## 2016-01-27 ENCOUNTER — Telehealth: Payer: Self-pay | Admitting: Adult Health

## 2016-01-27 NOTE — Telephone Encounter (Signed)
Spoke with pt. Pt is requesting a refill on Estradiol patches. I advised pt JAG was out of the office until Tuesday. Pt voiced understanding. She is going to change current patch on Sunday. Thanks!! [place]

## 2016-01-31 MED ORDER — ESTRADIOL 0.05 MG/24HR TD PTTW: 1.0000 | MEDICATED_PATCH | TRANSDERMAL | 12 refills | Status: DC

## 2016-01-31 NOTE — Telephone Encounter (Signed)
Will refill patches

## 2016-03-26 ENCOUNTER — Other Ambulatory Visit (INDEPENDENT_AMBULATORY_CARE_PROVIDER_SITE_OTHER): Payer: Self-pay | Admitting: Internal Medicine

## 2016-09-21 ENCOUNTER — Other Ambulatory Visit (INDEPENDENT_AMBULATORY_CARE_PROVIDER_SITE_OTHER): Payer: Self-pay | Admitting: Internal Medicine

## 2016-10-25 ENCOUNTER — Other Ambulatory Visit (HOSPITAL_COMMUNITY): Payer: Self-pay | Admitting: Internal Medicine

## 2016-10-25 DIAGNOSIS — Z1231 Encounter for screening mammogram for malignant neoplasm of breast: Secondary | ICD-10-CM

## 2016-10-31 ENCOUNTER — Ambulatory Visit (HOSPITAL_COMMUNITY): Payer: Medicare Other

## 2016-11-08 ENCOUNTER — Ambulatory Visit (HOSPITAL_COMMUNITY)
Admission: RE | Admit: 2016-11-08 | Discharge: 2016-11-08 | Disposition: A | Discharge status: Home / Self Care | Payer: Medicare Other | Source: Ambulatory Visit | Attending: Internal Medicine | Admitting: Internal Medicine

## 2016-11-08 DIAGNOSIS — Z1231 Encounter for screening mammogram for malignant neoplasm of breast: Secondary | ICD-10-CM | POA: Insufficient documentation

## 2017-01-18 ENCOUNTER — Other Ambulatory Visit: Payer: Self-pay | Admitting: Adult Health

## 2017-01-18 ENCOUNTER — Other Ambulatory Visit (INDEPENDENT_AMBULATORY_CARE_PROVIDER_SITE_OTHER): Payer: Self-pay | Admitting: Internal Medicine

## 2017-01-22 ENCOUNTER — Other Ambulatory Visit: Payer: Self-pay | Admitting: Adult Health

## 2017-02-22 ENCOUNTER — Encounter (INDEPENDENT_AMBULATORY_CARE_PROVIDER_SITE_OTHER): Payer: Self-pay | Admitting: *Deleted

## 2017-02-28 ENCOUNTER — Encounter: Payer: Self-pay | Admitting: Adult Health

## 2017-02-28 ENCOUNTER — Ambulatory Visit (INDEPENDENT_AMBULATORY_CARE_PROVIDER_SITE_OTHER): Payer: Medicare Other | Admitting: Adult Health

## 2017-02-28 VITALS — BP 120/80 | HR 80 | Ht 60.0 in | Wt 149.0 lb

## 2017-02-28 DIAGNOSIS — Z79899 Other long term (current) drug therapy: Secondary | ICD-10-CM

## 2017-02-28 DIAGNOSIS — Z79890 Hormone replacement therapy (postmenopausal): Secondary | ICD-10-CM

## 2017-02-28 MED ORDER — ESTRADIOL 0.05 MG/24HR TD PTTW: 1.0000 | MEDICATED_PATCH | TRANSDERMAL | 12 refills | Status: DC

## 2017-02-28 NOTE — Progress Notes (Signed)
Subjective:     Patient ID: [name], female   DOB: [birth year], 82 y.o.   MRN: [id number]  HPI [name] is a 82 year old white female in to get refills on Vivelle dot patches, she tried without them and has bad hot flashes. She is aware of risks, like stroke and still wants to use them, had physical with Dr Willey Blade. PCP is Dr Willey Blade.   Review of Systems +hot flashes, when tried to stop the patch Reviewed past medical,surgical, social and family history. Reviewed medications and allergies.     Objective:   Physical Exam BP 120/80 (BP Location: Left Arm, Patient Position: Sitting, Cuff Size: Small)   Pulse 80   Ht 5' (1.524 m)   Wt 149 lb (67.6 kg)   BMI 29.10 kg/m   Skin warm and dry. Lungs: clear to ausculation bilaterally. Cardiovascular: regular rate and rhythm.   PHQ 2 score 0.  Assessment:     1. Current use of estrogen therapy       Plan:     Meds ordered this encounter  Medications  . estradiol (VIVELLE-DOT) 0.05 MG/24HR patch    Sig: Place 1 patch (0.05 mg total) onto the skin 2 (two) times a week.    Dispense:  8 patch    Refill:  12    Order Specific Question:   Supervising Provider    Answer:   Tania Ade H [[id number]]  F/U in 1 year

## 2017-02-28 NOTE — Patient Instructions (Signed)
F/U in 1 year

## 2017-06-05 ENCOUNTER — Other Ambulatory Visit (HOSPITAL_COMMUNITY): Payer: Self-pay | Admitting: Internal Medicine

## 2017-06-05 DIAGNOSIS — Z780 Asymptomatic menopausal state: Secondary | ICD-10-CM

## 2017-06-07 ENCOUNTER — Other Ambulatory Visit (INDEPENDENT_AMBULATORY_CARE_PROVIDER_SITE_OTHER): Payer: Self-pay | Admitting: *Deleted

## 2017-06-07 DIAGNOSIS — Z860100 Personal history of colon polyps, unspecified: Secondary | ICD-10-CM

## 2017-06-13 ENCOUNTER — Ambulatory Visit (HOSPITAL_COMMUNITY)
Admission: RE | Admit: 2017-06-13 | Discharge: 2017-06-13 | Disposition: A | Discharge status: Home / Self Care | Payer: Medicare Other | Source: Ambulatory Visit | Attending: Internal Medicine | Admitting: Internal Medicine

## 2017-06-13 DIAGNOSIS — Z780 Asymptomatic menopausal state: Secondary | ICD-10-CM | POA: Diagnosis not present

## 2017-06-13 DIAGNOSIS — M85851 Other specified disorders of bone density and structure, right thigh: Secondary | ICD-10-CM | POA: Diagnosis not present

## 2017-07-24 ENCOUNTER — Other Ambulatory Visit (INDEPENDENT_AMBULATORY_CARE_PROVIDER_SITE_OTHER): Payer: Self-pay | Admitting: Internal Medicine

## 2017-07-29 ENCOUNTER — Encounter (INDEPENDENT_AMBULATORY_CARE_PROVIDER_SITE_OTHER): Payer: Self-pay | Admitting: *Deleted

## 2017-07-29 ENCOUNTER — Telehealth (INDEPENDENT_AMBULATORY_CARE_PROVIDER_SITE_OTHER): Payer: Self-pay | Admitting: *Deleted

## 2017-07-29 MED ORDER — PEG 3350-KCL-NA BICARB-NACL 420 G PO SOLR: 4000.0000 mL | Freq: Once | ORAL | 0 refills | Status: AC

## 2017-07-29 NOTE — Telephone Encounter (Signed)
Patient needs trilyte 

## 2017-08-21 ENCOUNTER — Telehealth (INDEPENDENT_AMBULATORY_CARE_PROVIDER_SITE_OTHER): Payer: Self-pay | Admitting: *Deleted

## 2017-08-21 NOTE — Telephone Encounter (Signed)
Referring MD/PCP: fagan   Procedure: tcs  Reason/Indication:  Hx polyps  Has patient had this procedure before?  Yes, 2013  If so, when, by whom and where?    Is there a family history of colon cancer?  no  Who?  What age when diagnosed?    Is patient diabetic?   no      Does patient have prosthetic heart valve or mechanical valve?  no  Do you have a pacemaker?  no  Has patient ever had endocarditis? no  Has patient had joint replacement within last 12 months?  no  Is patient constipated or do they take laxatives? Yes, takes metamucil  Does patient have a history of alcohol/drug use?  no  Is patient on blood thinner such as Coumadin, Plavix and/or Aspirin? no  Medications: synthroid 75 mg daily, nexium 180 mg daily, estradiol twice a week, zyrtec daily, biotin daily  Allergies: codeine, muscle relaxers  Medication Adjustment per Dr Lindi Adie, NP:   Procedure date & time: 09/12/17 at 830

## 2017-08-21 NOTE — Telephone Encounter (Signed)
agree

## 2017-09-12 ENCOUNTER — Ambulatory Visit (HOSPITAL_COMMUNITY)
Admission: RE | Admit: 2017-09-12 | Discharge: 2017-09-12 | Disposition: A | Discharge status: Home / Self Care | Payer: Medicare Other | Source: Ambulatory Visit | Attending: Internal Medicine | Admitting: Internal Medicine

## 2017-09-12 ENCOUNTER — Encounter (HOSPITAL_COMMUNITY)
Admission: RE | Disposition: A | Discharge status: Home / Self Care | Payer: Self-pay | Source: Ambulatory Visit | Attending: Internal Medicine

## 2017-09-12 ENCOUNTER — Other Ambulatory Visit: Payer: Self-pay

## 2017-09-12 DIAGNOSIS — Z79899 Other long term (current) drug therapy: Secondary | ICD-10-CM | POA: Insufficient documentation

## 2017-09-12 DIAGNOSIS — K648 Other hemorrhoids: Secondary | ICD-10-CM | POA: Insufficient documentation

## 2017-09-12 DIAGNOSIS — D124 Benign neoplasm of descending colon: Secondary | ICD-10-CM

## 2017-09-12 DIAGNOSIS — Z1211 Encounter for screening for malignant neoplasm of colon: Secondary | ICD-10-CM | POA: Diagnosis present

## 2017-09-12 DIAGNOSIS — K6289 Other specified diseases of anus and rectum: Secondary | ICD-10-CM | POA: Diagnosis not present

## 2017-09-12 DIAGNOSIS — Z885 Allergy status to narcotic agent status: Secondary | ICD-10-CM | POA: Diagnosis not present

## 2017-09-12 DIAGNOSIS — Z87891 Personal history of nicotine dependence: Secondary | ICD-10-CM | POA: Diagnosis not present

## 2017-09-12 DIAGNOSIS — Z86010 Personal history of colonic polyps: Secondary | ICD-10-CM

## 2017-09-12 DIAGNOSIS — D123 Benign neoplasm of transverse colon: Secondary | ICD-10-CM | POA: Diagnosis not present

## 2017-09-12 DIAGNOSIS — J449 Chronic obstructive pulmonary disease, unspecified: Secondary | ICD-10-CM | POA: Insufficient documentation

## 2017-09-12 DIAGNOSIS — E039 Hypothyroidism, unspecified: Secondary | ICD-10-CM | POA: Insufficient documentation

## 2017-09-12 DIAGNOSIS — D127 Benign neoplasm of rectosigmoid junction: Secondary | ICD-10-CM | POA: Insufficient documentation

## 2017-09-12 DIAGNOSIS — Z09 Encounter for follow-up examination after completed treatment for conditions other than malignant neoplasm: Secondary | ICD-10-CM | POA: Diagnosis not present

## 2017-09-12 DIAGNOSIS — E7800 Pure hypercholesterolemia, unspecified: Secondary | ICD-10-CM | POA: Diagnosis not present

## 2017-09-12 DIAGNOSIS — D120 Benign neoplasm of cecum: Secondary | ICD-10-CM | POA: Diagnosis not present

## 2017-09-12 HISTORY — PX: POLYPECTOMY: SHX5525

## 2017-09-12 HISTORY — PX: COLONOSCOPY: SHX5424

## 2017-09-12 SURGERY — COLONOSCOPY
Anesthesia: Moderate Sedation

## 2017-09-12 MED ORDER — MEPERIDINE HCL 50 MG/ML IJ SOLN
INTRAMUSCULAR | Status: DC | PRN
  Administered 2017-09-12 (×2): 25 mg via INTRAVENOUS

## 2017-09-12 MED ORDER — SODIUM CHLORIDE 0.9 % IV SOLN
INTRAVENOUS | Status: DC
  Administered 2017-09-12: 08:00:00 via INTRAVENOUS

## 2017-09-12 MED ORDER — MEPERIDINE HCL 50 MG/ML IJ SOLN
INTRAMUSCULAR | Status: AC
  Filled 2017-09-12: qty 1

## 2017-09-12 MED ORDER — MIDAZOLAM HCL 5 MG/5ML IJ SOLN
INTRAMUSCULAR | Status: DC | PRN
  Administered 2017-09-12: 2 mg via INTRAVENOUS
  Administered 2017-09-12: 1 mg via INTRAVENOUS
  Administered 2017-09-12: 2 mg via INTRAVENOUS

## 2017-09-12 MED ORDER — MIDAZOLAM HCL 5 MG/5ML IJ SOLN
INTRAMUSCULAR | Status: AC
  Filled 2017-09-12: qty 10

## 2017-09-12 MED ORDER — SIMETHICONE 40 MG/0.6ML PO SUSP
ORAL | Status: DC | PRN
  Administered 2017-09-12: 08:00:00

## 2017-09-12 NOTE — Discharge Instructions (Signed)
Colonoscopy, Adult, Care After This sheet gives you information about how to care for yourself after your procedure. Your doctor may also give you more specific instructions. If you have problems or questions, call your doctor. Follow these instructions at home: General instructions   For the first 24 hours after the procedure: ? Do not drive or use machinery. ? Do not sign important documents. ? Do not drink alcohol. ? Do your daily activities more slowly than normal. ? Eat foods that are soft and easy to digest. ? Rest often.  Take over-the-counter or prescription medicines only as told by your doctor.  It is up to you to get the results of your procedure. Ask your doctor, or the department performing the procedure, when your results will be ready. To help cramping and bloating:  Try walking around.  Put heat on your belly (abdomen) as told by your doctor. Use a heat source that your doctor recommends, such as a moist heat pack or a heating pad. ? Put a towel between your skin and the heat source. ? Leave the heat on for 20-30 minutes. ? Remove the heat if your skin turns bright red. This is especially important if you cannot feel pain, heat, or cold. You can get burned. Eating and drinking  Drink enough fluid to keep your pee (urine) clear or pale yellow.  Return to your normal diet as told by your doctor. Avoid heavy or fried foods that are hard to digest.  Avoid drinking alcohol for as long as told by your doctor. Contact a doctor if:  You have blood in your poop (stool) 2-3 days after the procedure. Get help right away if:  You have more than a small amount of blood in your poop.  You see large clumps of tissue (blood clots) in your poop.  Your belly is swollen.  You feel sick to your stomach (nauseous).  You throw up (vomit).  You have a fever.  You have belly pain that gets worse, and medicine does not help your pain. This information is not intended to  replace advice given to you by your health care provider. Make sure you discuss any questions you have with your health care provider. Document Released: 06/16/2010 Document Revised: 02/06/2016 Document Reviewed: 02/06/2016 Elsevier Interactive Patient Education  2017 [place].    Colon Polyps Polyps are tissue growths inside the body. Polyps can grow in many places, including the large intestine (colon). A polyp may be a round bump or a mushroom-shaped growth. You could have one polyp or several. Most colon polyps are noncancerous (benign). However, some colon polyps can become cancerous over time. What are the causes? The exact cause of colon polyps is not known. What increases the risk? This condition is more likely to develop in people who:  Have a family history of colon cancer or colon polyps.  Are older than 22 or older than 45 if they are African American.  Have inflammatory bowel disease, such as ulcerative colitis or Crohn disease.  Are overweight.  Smoke cigarettes.  Do not get enough exercise.  Drink too much alcohol.  Eat a diet that is: ? High in fat and red meat. ? Low in fiber.  Had childhood cancer that was treated with abdominal radiation.  What are the signs or symptoms? Most polyps do not cause symptoms. If you have symptoms, they may include:  Blood coming from your rectum when having a bowel movement.  Blood in your stool.The stool may  look dark red or black.  A change in bowel habits, such as constipation or diarrhea.  How is this diagnosed? This condition is diagnosed with a colonoscopy. This is a procedure that uses a lighted, flexible scope to look at the inside of your colon. How is this treated? Treatment for this condition involves removing any polyps that are found. Those polyps will then be tested for cancer. If cancer is found, your health care provider will talk to you about options for colon cancer treatment. Follow these  instructions at home: Diet  Eat plenty of fiber, such as fruits, vegetables, and whole grains.  Eat foods that are high in calcium and vitamin D, such as milk, cheese, yogurt, eggs, liver, fish, and broccoli.  Limit foods high in fat, red meats, and processed meats, such as hot dogs, sausage, bacon, and lunch meats.  Maintain a healthy weight, or lose weight if recommended by your health care provider. General instructions  Do not smoke cigarettes.  Do not drink alcohol excessively.  Keep all follow-up visits as told by your health care provider. This is important. This includes keeping regularly scheduled colonoscopies. Talk to your health care provider about when you need a colonoscopy.  Exercise every day or as told by your health care provider. Contact a health care provider if:  You have new or worsening bleeding during a bowel movement.  You have new or increased blood in your stool.  You have a change in bowel habits.  You unexpectedly lose weight. This information is not intended to replace advice given to you by your health care provider. Make sure you discuss any questions you have with your health care provider.    No aspirin or NSAIDs for 24 hours. Resume other medications and diet as before. No driving for 24 hours. Physician will call with biopsy results.   Hemorrhoids Hemorrhoids are swollen veins in and around the rectum or anus. Hemorrhoids can cause pain, itching, or bleeding. Most of the time, they do not cause serious problems. They usually get better with diet changes, lifestyle changes, and other home treatments. Follow these instructions at home: Eating and drinking  Eat foods that have fiber, such as whole grains, beans, nuts, fruits, and vegetables. Ask your doctor about taking products that have added fiber (fibersupplements).  Drink enough fluid to keep your pee (urine) clear or pale yellow. For Pain and Swelling  Take a warm-water bath  (sitz bath) for 20 minutes to ease pain. Do this 3-4 times a day.  If directed, put ice on the painful area. It may be helpful to use ice between your warm baths. ? Put ice in a plastic bag. ? Place a towel between your skin and the bag. ? Leave the ice on for 20 minutes, 2-3 times a day. General instructions  Take over-the-counter and prescription medicines only as told by your doctor. ? Medicated creams and medicines that are inserted into the anus (suppositories) may be used or applied as told.  Exercise often.  Go to the bathroom when you have the urge to poop (to have a bowel movement). Do not wait.  Avoid pushing too hard (straining) when you poop.  Keep the butt area dry and clean. Use wet toilet paper or moist paper towels.  Do not sit on the toilet for a long time. Contact a doctor if:  You have any of these: ? Pain and swelling that do not get better with treatment or medicine. ? Bleeding that will  not stop. ? Trouble pooping or you cannot poop. ? Pain or swelling outside the area of the hemorrhoids. This information is not intended to replace advice given to you by your health care provider. Make sure you discuss any questions you have with your health care provider. Document Released: 02/21/2008 Document Revised: 10/20/2015 Document Reviewed: 01/26/2015 Elsevier Interactive Patient Education  [employer].

## 2017-09-12 NOTE — H&P (Signed)
[name] is an 83 y.o. female.   Chief Complaint: Patient is here for colonoscopy. HPI: Patient is 83 year old Caucasian female who has history of colonic adenomas and is here for surveillance colonoscopy.  She denies abdominal pain change in bowel habits or rectal bleeding. Family history is negative for CRC.  Past Medical History:  Diagnosis Date  . Arthritis    hands   . H/o Atrial fibrillation and flutter   . Bloody sputum    "started 12/2011" (03/18/2012)  . Burning mouth syndrome   . COPD (chronic obstructive pulmonary disease) ([place]) 02/12/2012   PFT 02/08/12>>FEV1 1.63 (95%), FEV1% 65, TLC 4.12 (94%), DLCO 74%, no BD   . Current use of estrogen therapy 01/03/2015  . Fibroid   . GERD (gastroesophageal reflux disease)   . H/O hiatal hernia   . Heart murmur    02/2012 treadmill & echo, told that she has a "leak", but cleared for surgery    . History of chronic bronchitis    "controlled after GERD controlled" (03/18/2012)  . Hypercholesteremia   . Hypothyroidism   . Knee pain   . Lesion of right lung   . Lung mass    R upper lobe   . Ocular migraine    "I have the flashing lights" (03/18/2012)  . Plantar fascial fibromatosis   . Post menopausal syndrome   . Urinary incontinence    "slight bit" (03/18/2012)  . Vaginal atrophy 07/02/2013  . Vaginal discharge 02/14/2015    Past Surgical History:  Procedure Laterality Date  . ABDOMINAL HYSTERECTOMY  1980  . BLADDER SUSPENSION  2007   Anterior repair  . CATARACT EXTRACTION W/ INTRAOCULAR LENS  IMPLANT, BILATERAL  1990's  . COLONOSCOPY    . CYSTOCELE REPAIR  2007  . EYE SURGERY     implants in both eyes   . LOBECTOMY  03/13/2012   Procedure: LOBECTOMY;  Surgeon: Gaye Pollack, MD;  Location: [facility] OR;  Service: Thoracic;  Laterality: Right;  Right Thoracotomy, Right Upper Lobectomy, and lymphnode sampling  . LYMPH NODE BIOPSY  03/13/2012   Procedure: LYMPH NODE BIOPSY;  Surgeon: Gaye Pollack, MD;  Location: [facility] OR;   Service: Thoracic;  Laterality: Right;  Right Thoracotomy, Right Upper Lobectomy, and lymphnode sampling  . SKIN CANCER EXCISION  05/2011   RUE "above elbow" (03/18/2012)  . THORACOTOMY  03/13/2012   Procedure: THORACOTOMY MAJOR;  Surgeon: Gaye Pollack, MD;  Location: [facility] OR;  Service: Thoracic;  Laterality: Right;  Right Thoracotomy, Right Upper Lobectomy, and lymphnode sampling  . UPPER GASTROINTESTINAL ENDOSCOPY    . VIDEO BRONCHOSCOPY  03/13/2012   Procedure: VIDEO BRONCHOSCOPY;  Surgeon: Gaye Pollack, MD;  Location: [facility] OR;  Service: Thoracic;  Laterality: N/A;    Family History  Problem Relation Age of Onset  . Uterine cancer Mother   . Heart disease Mother   . Osteoporosis Mother   . Other Mother        abdominal aortic aneurysm/chronic lymphoid leukemia  . Pancreatic cancer Father        pancreatic  . Polycythemia Father   . Multiple sclerosis Daughter   . Cancer Maternal Aunt        Oral cancer   Social History:  reports that she quit smoking about 41 years ago. Her smoking use included cigarettes. She has a 20.00 pack-year smoking history. She has never used smokeless tobacco. She reports that she drinks about 1.2 oz of alcohol per week. She reports  that she does not use drugs.  Allergies:  Allergies  Allergen Reactions  . Codeine Nausea And Vomiting  . Other Swelling and Other (See Comments)    Muscle Relaxers-weight gain    Medications Prior to Admission  Medication Sig Dispense Refill  . acetaminophen (TYLENOL) 500 MG tablet Take 1,000 mg by mouth every 6 (six) hours as needed for moderate pain or headache.     . Biotin 1 MG CAPS Take 1 mg by mouth 2 (two) times daily.    . Carboxymethylcellulose Sodium (THERATEARS OP) Place 1 drop into both eyes 4 (four) times daily as needed (for dry eyes).    . cetirizine (ZYRTEC) 10 MG tablet Take 10 mg by mouth daily.    . Cholecalciferol (VITAMIN D3 PO) Take 1 capsule by mouth daily.    . diclofenac sodium (VOLTAREN) 1 %  GEL Apply 1 application topically 3 (three) times daily as needed (for pain).    [name] esomeprazole (NEXIUM) 40 MG capsule Take 40 mg by mouth daily.     [name] estradiol (VIVELLE-DOT) 0.05 MG/24HR patch Place 1 patch (0.05 mg total) onto the skin 2 (two) times a week. 8 patch 12  . fluticasone (FLONASE) 50 MCG/ACT nasal spray Place 1 spray into both nostrils daily as needed for allergies or rhinitis.    [name] levothyroxine (SYNTHROID, LEVOTHROID) 75 MCG tablet Take 75 mcg by mouth daily before breakfast.     . Multiple Vitamin (MULTIVITAMIN) capsule Take 1 capsule by mouth daily.     . psyllium (METAMUCIL) 58.6 % packet Take 1 packet by mouth daily.    . metroNIDAZOLE (METROGEL VAGINAL) 0.75 % vaginal gel Use 1 applicator in vagina at bedtime for 7 night (Patient not taking: Reported on 04/14/2015) 70 g 0    No results found for this or any previous visit (from the past 48 hour(s)). No results found.  ROS  Blood pressure 131/89, pulse 65, temperature 98.7 F (37.1 C), temperature source Oral, resp. rate 20, height 5' 0.5" (1.537 m), weight 149 lb (67.6 kg), SpO2 99 %. Physical Exam  Constitutional: She appears well-developed and well-nourished.  HENT:  Mouth/Throat: Oropharynx is clear and moist.  Eyes: Conjunctivae are normal. No scleral icterus.  Neck: No thyromegaly present.  Cardiovascular: Normal rate, regular rhythm and normal heart sounds.  No murmur heard. Respiratory: Effort normal and breath sounds normal.  GI: Soft. She exhibits no distension and no mass. There is no tenderness.  Musculoskeletal: She exhibits no edema.  Lymphadenopathy:    She has no cervical adenopathy.  Neurological: She is alert.  Skin: Skin is warm and dry.     Assessment/Plan History of colonic adenomas. Surveillance colonoscopy.  Hildred Laser, MD 09/12/2017, 8:15 AM

## 2017-09-12 NOTE — Op Note (Signed)
[facility] Patient Name: Angelica [name] Procedure Date: 09/12/2017 8:08 AM MRN: [id number] Date of Birth: 12-26-1934 Attending MD: Hildred Laser , MD CSN: [id number] Age: 83 Admit Type: Outpatient Procedure:                Colonoscopy Indications:              High risk colon cancer surveillance: Personal                            history of colonic polyps Providers:                Hildred Laser, MD, Otis Peak B. Sharon Seller, RN, Nelma Rothman, Technician Referring MD:             Asencion Noble Medicines:                Meperidine 50 mg IV, Midazolam 5 mg IV Complications:            No immediate complications. Estimated Blood Loss:     Estimated blood loss was minimal. Procedure:                Pre-Anesthesia Assessment:                           - Prior to the procedure, a History and Physical                            was performed, and patient medications and                            allergies were reviewed. The patient's tolerance of                            previous anesthesia was also reviewed. The risks                            and benefits of the procedure and the sedation                            options and risks were discussed with the patient.                            All questions were answered, and informed consent                            was obtained. Prior Anticoagulants: The patient has                            taken no previous anticoagulant or antiplatelet                            agents. ASA Grade Assessment: II - A patient with  mild systemic disease. After reviewing the risks                            and benefits, the patient was deemed in                            satisfactory condition to undergo the procedure.                           After obtaining informed consent, the colonoscope                            was passed under direct vision. Throughout the                            procedure,  the patient's blood pressure, pulse, and                            oxygen saturations were monitored continuously. The                            EC-[id number] ([id number]) scope was introduced through                            the anus and advanced to the the cecum, identified                            by appendiceal orifice and ileocecal valve. The                            colonoscopy was performed without difficulty. The                            patient tolerated the procedure well. The quality                            of the bowel preparation was excellent. The                            ileocecal valve, appendiceal orifice, and rectum                            were photographed. Scope In: 8:26:28 AM Scope Out: 8:48:00 AM Scope Withdrawal Time: 0 hours 15 minutes 23 seconds  Total Procedure Duration: 0 hours 21 minutes 32 seconds  Findings:      The perianal and digital rectal examinations were normal.      [name] sessile polyps were found in the recto-sigmoid colon, descending       colon, transverse colon, hepatic flexure and cecum. The polyps were       small in size. These were biopsied with a cold forceps for histology.       The pathology specimen was placed into Bottle Number 1.      A 5 mm polyp was found in the splenic flexure. The polyp was sessile.  The polyp was removed with a cold snare. Resection and retrieval were       complete. The pathology specimen was placed into Bottle Number 1.      Internal hemorrhoids were found during retroflexion. The hemorrhoids       were small.      Anal papilla(e) were hypertrophied. Impression:               - [name] small polyps at the recto-sigmoid colon, in                            the descending colon, in the transverse colon, at                            the hepatic flexure and in the cecum. Biopsied.                           - One 5 mm polyp at the splenic flexure, removed                            with a cold snare.  Resected and retrieved.                           - Internal hemorrhoids.                           - Anal papilla(e) were hypertrophied. Moderate Sedation:      Moderate (conscious) sedation was administered by the endoscopy nurse       and supervised by the endoscopist. The following parameters were       monitored: oxygen saturation, heart rate, blood pressure, CO2       capnography and response to care. Total physician intraservice time was       28 minutes. Recommendation:           - Patient has a contact number available for                            emergencies. The signs and symptoms of potential                            delayed complications were discussed with the                            patient. Return to normal activities tomorrow.                            Written discharge instructions were provided to the                            patient.                           - Resume previous diet today.                           - Continue present medications.                           -  No aspirin, ibuprofen, naproxen, or other                            non-steroidal anti-inflammatory drugs for 1 day.                           - Await pathology results.                           - No recommendation at this time regarding repeat                            colonoscopy. Procedure Code(s):        --- Professional ---                           [id number], Colonoscopy, flexible; with removal of                            tumor(s), polyp(s), or other lesion(s) by snare                            technique                           45380, 59, Colonoscopy, flexible; with biopsy,                            single or multiple                           G0500, Moderate sedation services provided by the                            same physician or other qualified health care                            professional performing a gastrointestinal                            endoscopic  service that sedation supports,                            requiring the presence of an independent trained                            observer to assist in the monitoring of the                            patient's level of consciousness and physiological                            status; initial 15 minutes of intra-service time;                            patient age 34 years or older (additional  time may                            be reported with [phone], as appropriate)                           [phone], Moderate sedation services provided by the                            same physician or other qualified health care                            professional performing the diagnostic or                            therapeutic service that the sedation supports,                            requiring the presence of an independent trained                            observer to assist in the monitoring of the                            patient's level of consciousness and physiological                            status; each additional 15 minutes intraservice                            time (List separately in addition to code for                            primary service) Diagnosis Code(s):        --- Professional ---                           Z86.010, Personal history of colonic polyps                           D12.7, Benign neoplasm of rectosigmoid junction                           D12.4, Benign neoplasm of descending colon                           D12.3, Benign neoplasm of transverse colon (hepatic                            flexure or splenic flexure)                           D12.0, Benign neoplasm of cecum                           K62.89, Other specified diseases of  anus and rectum                           K64.8, Other hemorrhoids CPT copyright 2017 [employer]. All rights reserved. The codes documented in this report are preliminary and upon coder review may  be  revised to meet current compliance requirements. Hildred Laser, MD Hildred Laser, MD 09/12/2017 8:56:39 AM This report has been signed electronically. Number of Addenda: 0

## 2017-09-18 ENCOUNTER — Encounter (HOSPITAL_COMMUNITY): Payer: Self-pay | Admitting: Internal Medicine

## 2018-03-25 ENCOUNTER — Other Ambulatory Visit: Payer: Self-pay | Admitting: Adult Health

## 2018-04-21 ENCOUNTER — Ambulatory Visit: Payer: Medicare Other | Admitting: Adult Health

## 2018-04-22 ENCOUNTER — Ambulatory Visit: Payer: Medicare Other | Admitting: Adult Health

## 2018-04-22 ENCOUNTER — Other Ambulatory Visit: Payer: Self-pay

## 2018-04-22 ENCOUNTER — Encounter: Payer: Self-pay | Admitting: Adult Health

## 2018-04-22 VITALS — BP 123/72 | HR 87 | Ht 60.25 in | Wt 152.0 lb

## 2018-04-22 DIAGNOSIS — Z79899 Other long term (current) drug therapy: Secondary | ICD-10-CM | POA: Diagnosis not present

## 2018-04-22 MED ORDER — ESTRADIOL 0.05 MG/24HR TD PTTW
MEDICATED_PATCH | TRANSDERMAL | 12 refills | Status: DC
Start: 2018-04-22 — End: 2019-05-26

## 2018-04-22 NOTE — Progress Notes (Signed)
  Subjective:     Patient ID: [name], female   DOB: Jan 31, 1935, 83 y.o.   MRN: [id number]  HPI [name] is a 83 year old white female, recently widowed in to get refills on estrogen patches.She is sp hysterectomy. She had physical and labs with Dr Willey Blade about 3 weeks ago and all was good,she says. PCP is Dr Willey Blade.  Review of Systems +hot flashes, if not on the patch  Reviewed past medical,surgical, social and family history. Reviewed medications and allergies.     Objective:   Physical Exam BP 123/72 (BP Location: Left Arm, Patient Position: Sitting, Cuff Size: Normal)   Pulse 87   Ht 5' 0.25" (1.53 m)   Wt 152 lb (68.9 kg)   BMI 29.44 kg/m   Skin warm and dry. Neck: mid line trachea, normal thyroid, good ROM, no lymphadenopathy noted. Lungs: clear to ausculation bilaterally. Cardiovascular: regular rate and rhythm.No carotid bruits heard. Fall risk low. Pt is aware of risks with ET and wants to continue the patches.      Assessment:     1. Current use of estrogen therapy       Plan:     Meds ordered this encounter  Medications  . estradiol (DOTTI) 0.05 MG/24HR patch    Sig: APPLY ONE PATCH (0.05MG  TOTAL) ONTO THE SKIN TWICE WEEKLY    Dispense:  8 patch    Refill:  12    Order Specific Question:   Supervising Provider    Answer:   Tania Ade H [[id number]]  F/U in 1 year  Get physical and labs with Dr Willey Blade Mammogram yearly

## 2018-09-25 ENCOUNTER — Other Ambulatory Visit (INDEPENDENT_AMBULATORY_CARE_PROVIDER_SITE_OTHER): Payer: Self-pay | Admitting: Internal Medicine

## 2019-04-16 ENCOUNTER — Other Ambulatory Visit (HOSPITAL_COMMUNITY): Payer: Self-pay | Admitting: Internal Medicine

## 2019-04-16 DIAGNOSIS — Z1231 Encounter for screening mammogram for malignant neoplasm of breast: Secondary | ICD-10-CM

## 2019-04-29 ENCOUNTER — Other Ambulatory Visit: Payer: Self-pay

## 2019-04-29 ENCOUNTER — Ambulatory Visit (HOSPITAL_COMMUNITY)
Admission: RE | Admit: 2019-04-29 | Discharge: 2019-04-29 | Disposition: A | Discharge status: Home / Self Care | Payer: Medicare Other | Source: Ambulatory Visit | Attending: Internal Medicine | Admitting: Internal Medicine

## 2019-04-29 DIAGNOSIS — Z1231 Encounter for screening mammogram for malignant neoplasm of breast: Secondary | ICD-10-CM | POA: Insufficient documentation

## 2019-05-01 ENCOUNTER — Other Ambulatory Visit (HOSPITAL_COMMUNITY): Payer: Self-pay | Admitting: Internal Medicine

## 2019-05-01 DIAGNOSIS — R928 Other abnormal and inconclusive findings on diagnostic imaging of breast: Secondary | ICD-10-CM

## 2019-05-05 ENCOUNTER — Ambulatory Visit (HOSPITAL_COMMUNITY)
Admission: RE | Admit: 2019-05-05 | Discharge: 2019-05-05 | Disposition: A | Discharge status: Home / Self Care | Payer: Medicare Other | Source: Ambulatory Visit | Attending: Internal Medicine | Admitting: Internal Medicine

## 2019-05-05 ENCOUNTER — Other Ambulatory Visit: Payer: Self-pay

## 2019-05-05 ENCOUNTER — Ambulatory Visit (HOSPITAL_COMMUNITY): Admission: RE | Admit: 2019-05-05 | Payer: Medicare Other | Source: Ambulatory Visit

## 2019-05-05 DIAGNOSIS — R928 Other abnormal and inconclusive findings on diagnostic imaging of breast: Secondary | ICD-10-CM | POA: Insufficient documentation

## 2019-05-19 ENCOUNTER — Other Ambulatory Visit: Payer: Self-pay | Admitting: Adult Health

## 2019-06-15 ENCOUNTER — Other Ambulatory Visit: Payer: Self-pay

## 2019-06-15 ENCOUNTER — Ambulatory Visit: Payer: Medicare PPO | Admitting: Adult Health

## 2019-06-15 ENCOUNTER — Encounter: Payer: Self-pay | Admitting: Adult Health

## 2019-06-15 VITALS — BP 122/71 | HR 67 | Ht 60.5 in | Wt 149.6 lb

## 2019-06-15 DIAGNOSIS — Z79899 Other long term (current) drug therapy: Secondary | ICD-10-CM

## 2019-06-15 MED ORDER — ESTRADIOL 0.05 MG/24HR TD PTTW
MEDICATED_PATCH | TRANSDERMAL | 4 refills | Status: DC
Start: 2019-06-15 — End: 2020-06-30

## 2019-06-15 NOTE — Progress Notes (Signed)
  Subjective:     Patient ID: [name], female   DOB: 11-25-1934, 85 y.o.   MRN: [id number]  HPI [name] is a 85 year old white, widowed, sp hysterectomy in to get her estrogen patch refilled, she is aware of risks and benefits and wants to continue them.  PCP is Dr Willey Blade.   Review of Systems Denies any hot flashes Goes to sleep easily but wakes about 3:30 and my go back to sleep.usually gets 5-6 hours and may nap if watching TV during the day. No currently sexually active,(she was asking about sex.. if has sex use good lubricate like astro glide)(could add estrogen cream if needed)  Reviewed past medical,surgical, social and family history. Reviewed medications and allergies.     Objective:   Physical Exam BP 122/71 (BP Location: Left Arm, Patient Position: Sitting, Cuff Size: Normal)   Pulse 67   Ht 5' 0.5" (1.537 m)   Wt 149 lb 9.6 oz (67.9 kg)   BMI 28.74 kg/m  Skin warm and dry. Neck: mid line trachea, normal thyroid, good ROM, no lymphadenopathy noted. Lungs: clear to ausculation bilaterally. Cardiovascular: regular rate and rhythm.No carotid bruits heard Fall risk is low PHQ 2 score 0    Assessment:     1. Current use of estrogen therapy Refilled her patch Meds ordered this encounter  Medications  . estradiol (DOTTI) 0.05 MG/24HR patch    Sig: APPLY ONE PATCH (0.05MG  TOTAL) ONTO THE SKIN TWICE WEEKLY    Dispense:  24 patch    Refill:  4    Order Specific Question:   Supervising Provider    Answer:   Florian Buff [[id number]]      Plan:     Follow up in 1 year or sooner if needed

## 2019-07-21 DIAGNOSIS — L728 Other follicular cysts of the skin and subcutaneous tissue: Secondary | ICD-10-CM | POA: Diagnosis not present

## 2019-07-21 DIAGNOSIS — L819 Disorder of pigmentation, unspecified: Secondary | ICD-10-CM | POA: Diagnosis not present

## 2019-09-15 DIAGNOSIS — H43811 Vitreous degeneration, right eye: Secondary | ICD-10-CM | POA: Diagnosis not present

## 2019-12-15 DIAGNOSIS — D485 Neoplasm of uncertain behavior of skin: Secondary | ICD-10-CM | POA: Diagnosis not present

## 2019-12-15 DIAGNOSIS — L570 Actinic keratosis: Secondary | ICD-10-CM | POA: Diagnosis not present

## 2019-12-15 DIAGNOSIS — L821 Other seborrheic keratosis: Secondary | ICD-10-CM | POA: Diagnosis not present

## 2019-12-15 DIAGNOSIS — D239 Other benign neoplasm of skin, unspecified: Secondary | ICD-10-CM | POA: Diagnosis not present

## 2020-03-14 DIAGNOSIS — Z23 Encounter for immunization: Secondary | ICD-10-CM | POA: Diagnosis not present

## 2020-04-19 DIAGNOSIS — Z79899 Other long term (current) drug therapy: Secondary | ICD-10-CM | POA: Diagnosis not present

## 2020-04-19 DIAGNOSIS — M1990 Unspecified osteoarthritis, unspecified site: Secondary | ICD-10-CM | POA: Diagnosis not present

## 2020-04-19 DIAGNOSIS — E785 Hyperlipidemia, unspecified: Secondary | ICD-10-CM | POA: Diagnosis not present

## 2020-04-19 DIAGNOSIS — I700 Atherosclerosis of aorta: Secondary | ICD-10-CM | POA: Diagnosis not present

## 2020-04-19 DIAGNOSIS — E039 Hypothyroidism, unspecified: Secondary | ICD-10-CM | POA: Diagnosis not present

## 2020-04-19 DIAGNOSIS — K219 Gastro-esophageal reflux disease without esophagitis: Secondary | ICD-10-CM | POA: Diagnosis not present

## 2020-04-26 DIAGNOSIS — I700 Atherosclerosis of aorta: Secondary | ICD-10-CM | POA: Diagnosis not present

## 2020-04-26 DIAGNOSIS — E039 Hypothyroidism, unspecified: Secondary | ICD-10-CM | POA: Diagnosis not present

## 2020-04-26 DIAGNOSIS — Z6827 Body mass index (BMI) 27.0-27.9, adult: Secondary | ICD-10-CM | POA: Diagnosis not present

## 2020-04-26 DIAGNOSIS — Z0001 Encounter for general adult medical examination with abnormal findings: Secondary | ICD-10-CM | POA: Diagnosis not present

## 2020-04-26 DIAGNOSIS — R051 Acute cough: Secondary | ICD-10-CM | POA: Diagnosis not present

## 2020-04-26 DIAGNOSIS — K219 Gastro-esophageal reflux disease without esophagitis: Secondary | ICD-10-CM | POA: Diagnosis not present

## 2020-04-28 ENCOUNTER — Other Ambulatory Visit (HOSPITAL_COMMUNITY): Payer: Self-pay | Admitting: Internal Medicine

## 2020-04-28 DIAGNOSIS — R011 Cardiac murmur, unspecified: Secondary | ICD-10-CM

## 2020-05-09 ENCOUNTER — Ambulatory Visit (HOSPITAL_COMMUNITY)
Admission: RE | Admit: 2020-05-09 | Discharge: 2020-05-09 | Disposition: A | Discharge status: Home / Self Care | Payer: Medicare PPO | Source: Ambulatory Visit | Attending: Internal Medicine | Admitting: Internal Medicine

## 2020-05-09 ENCOUNTER — Other Ambulatory Visit: Payer: Self-pay

## 2020-05-09 DIAGNOSIS — R011 Cardiac murmur, unspecified: Secondary | ICD-10-CM | POA: Diagnosis not present

## 2020-05-09 LAB — ECHOCARDIOGRAM COMPLETE
AR max vel: 1.65 cm2
AV Area VTI: 1.59 cm2
AV Area mean vel: 1.49 cm2
AV Mean grad: 9 mmHg
AV Peak grad: 15.1 mmHg
Ao pk vel: 1.95 m/s
Area-P 1/2: 3.21 cm2
S' Lateral: 1.8 cm

## 2020-05-09 NOTE — Progress Notes (Signed)
*  PRELIMINARY RESULTS* Echocardiogram 2D Echocardiogram has been performed.  Samuel Germany 05/09/2020, 1:48 PM

## 2020-05-30 ENCOUNTER — Other Ambulatory Visit (HOSPITAL_COMMUNITY): Payer: Self-pay | Admitting: Internal Medicine

## 2020-05-30 DIAGNOSIS — Z1231 Encounter for screening mammogram for malignant neoplasm of breast: Secondary | ICD-10-CM

## 2020-06-02 ENCOUNTER — Ambulatory Visit (HOSPITAL_COMMUNITY): Payer: Medicare PPO

## 2020-06-16 ENCOUNTER — Ambulatory Visit: Payer: Medicare PPO | Admitting: Adult Health

## 2020-06-20 ENCOUNTER — Ambulatory Visit (HOSPITAL_COMMUNITY): Payer: Medicare PPO

## 2020-06-30 ENCOUNTER — Ambulatory Visit: Payer: Medicare PPO | Admitting: Adult Health

## 2020-06-30 ENCOUNTER — Telehealth: Payer: Self-pay | Admitting: *Deleted

## 2020-06-30 ENCOUNTER — Other Ambulatory Visit: Payer: Self-pay

## 2020-06-30 ENCOUNTER — Encounter: Payer: Self-pay | Admitting: Adult Health

## 2020-06-30 VITALS — BP 121/70 | HR 69 | Ht 60.0 in | Wt 143.5 lb

## 2020-06-30 DIAGNOSIS — Z79899 Other long term (current) drug therapy: Secondary | ICD-10-CM | POA: Diagnosis not present

## 2020-06-30 DIAGNOSIS — B369 Superficial mycosis, unspecified: Secondary | ICD-10-CM | POA: Diagnosis not present

## 2020-06-30 MED ORDER — NYSTATIN 100000 UNIT/GM EX POWD: 1.0000 "application " | Freq: Three times a day (TID) | CUTANEOUS | 1 refills | Status: DC

## 2020-06-30 MED ORDER — TRIAMCINOLONE ACETONIDE 0.5 % EX OINT: 1.0000 "application " | TOPICAL_OINTMENT | Freq: Two times a day (BID) | CUTANEOUS | 0 refills | Status: DC

## 2020-06-30 MED ORDER — ESTRADIOL 0.05 MG/24HR TD PTTW: MEDICATED_PATCH | TRANSDERMAL | 4 refills | Status: DC

## 2020-06-30 NOTE — Progress Notes (Signed)
  Subjective:     Patient ID: [name], female   DOB: 1935-05-24, 86 y.o.   MRN: [id number]  HPI [name] is a 86 year old white female, widowed, sp hysterectomy in to get ET patch refilled, she is aware of risks and wants to continue using. She also complains of rash under both breasts, R>L and has used Nizoral cream. She has some swelling at times in left ankle and when does will be up a few pounds and breasts feel fuller. PCP is Dr Willey Blade.  Review of Systems Rash under breasts Swelling at times on left ankle  Reviewed past medical,surgical, social and family history. Reviewed medications and allergies.     Objective:   Physical Exam BP 121/70 (BP Location: Left Arm, Patient Position: Sitting, Cuff Size: Normal)   Pulse 69   Ht 5' (1.524 m)   Wt 143 lb 8 oz (65.1 kg)   BMI 28.03 kg/m  Skin warm and dry. Lungs: clear to ausculation bilaterally. Cardiovascular: regular rate and rhythm.   Has skin fungus under both breasts, R>L Fall risk is low  Upstream - 06/30/20 1427      Pregnancy Intention Screening   Does the patient want to become pregnant in the next year? N/A    Does the patient's partner want to become pregnant in the next year? N/A    Would the patient like to discuss contraceptive options today? N/A      Contraception Wrap Up   Current Method Female Sterilization   hyst   End Method Female Sterilization   hyst   Contraception Counseling Provided No          Assessment:     1. Superficial fungus infection of skin Try nystatin powder and will rx triamcinolone cream to use light layer to skin and get bra with no under wire  2. Current use of estrogen therapy Will refill estradiol patch  Meds ordered this encounter  Medications  . nystatin (MYCOSTATIN/NYSTOP) powder    Sig: Apply 1 application topically 3 (three) times daily.    Dispense:  45 g    Refill:  1    Order Specific Question:   Supervising Provider    Answer:   EURE, LUTHER H [[id number]]  .  triamcinolone ointment (KENALOG) 0.5 %    Sig: Apply 1 application topically 2 (two) times daily.    Dispense:  30 g    Refill:  0    Order Specific Question:   Supervising Provider    Answer:   Elonda Husky, LUTHER H [[id number]]  . estradiol (DOTTI) 0.05 MG/24HR patch    Sig: APPLY ONE PATCH (0.05MG  TOTAL) ONTO THE SKIN TWICE WEEKLY    Dispense:  24 patch    Refill:  4    Order Specific Question:   Supervising Provider    Answer:   Florian Buff [[id number]]      Plan:   - has mammogram Monday  Follow up in 1 year or sooner if needed

## 2020-06-30 NOTE — Telephone Encounter (Signed)
Pt's Nystatin powder has been approved. PA # [id number] approved 06/30/20-05/27/21. Pharmacy aware. [place]

## 2020-07-04 ENCOUNTER — Ambulatory Visit (HOSPITAL_COMMUNITY): Payer: Medicare PPO

## 2020-07-18 ENCOUNTER — Ambulatory Visit (HOSPITAL_COMMUNITY)
Admission: RE | Admit: 2020-07-18 | Discharge: 2020-07-18 | Disposition: A | Discharge status: Home / Self Care | Payer: Medicare PPO | Source: Ambulatory Visit | Attending: Internal Medicine | Admitting: Internal Medicine

## 2020-07-18 ENCOUNTER — Other Ambulatory Visit: Payer: Self-pay

## 2020-07-18 DIAGNOSIS — Z1231 Encounter for screening mammogram for malignant neoplasm of breast: Secondary | ICD-10-CM | POA: Diagnosis not present

## 2020-09-15 ENCOUNTER — Ambulatory Visit: Payer: Medicare PPO | Admitting: Adult Health

## 2020-09-15 ENCOUNTER — Encounter: Payer: Self-pay | Admitting: Adult Health

## 2020-09-15 ENCOUNTER — Other Ambulatory Visit: Payer: Self-pay

## 2020-09-15 VITALS — BP 122/73 | HR 72 | Ht 60.0 in | Wt 143.4 lb

## 2020-09-15 DIAGNOSIS — L853 Xerosis cutis: Secondary | ICD-10-CM | POA: Insufficient documentation

## 2020-09-15 MED ORDER — NYSTATIN 100000 UNIT/GM EX OINT: 1.0000 "application " | TOPICAL_OINTMENT | Freq: Two times a day (BID) | CUTANEOUS | 0 refills | Status: DC

## 2020-09-15 NOTE — Progress Notes (Signed)
  Subjective:     Patient ID: [name], female   DOB: 1934-10-03, 86 y.o.   MRN: [id number]  HPI [name] is a 86 year old white female,widowed, sp hysterectomy in complaining of dry itchy spot on right breast.  She has been busy packing up he house is moving. She is building new house in town. Had normal mammogram 07/18/20. PCP is Dr Willey Blade.   Review of Systems Has dry itchy area right breast Reviewed past medical,surgical, social and family history. Reviewed medications and allergies.     Objective:   Physical Exam BP 122/73 (BP Location: Right Arm, Patient Position: Sitting, Cuff Size: Normal)   Pulse 72   Ht 5' (1.524 m)   Wt 143 lb 6.4 oz (65 kg)   BMI 28.01 kg/m   Skin warm and dry,  Breasts:no dominate palpable mass, retraction or nipple discharge, but has 1 cm area in right areola at about 7-8 0' clock of scaly dry skin, brownish in color     Upstream - 09/15/20 1008      Pregnancy Intention Screening   Does the patient want to become pregnant in the next year? No    Does the patient's partner want to become pregnant in the next year? No    Would the patient like to discuss contraceptive options today? No      Contraception Wrap Up   Current Method --   hyst   End Method --   hyst   Contraception Counseling Provided No          Assessment:     1. Dry skin dermatitis, right breast Will get her to mix nystatin and triamcinolone, has triamcinolone at home and apply bid Meds ordered this encounter  Medications  . nystatin ointment (MYCOSTATIN)    Sig: Apply 1 application topically 2 (two) times daily. Mix nystatin and triamcinolone together and apply thin layer to area on breast bid    Dispense:  30 g    Refill:  0    Order Specific Question:   Supervising Provider    Answer:   Florian Buff [[id number]]      Plan:     Return in 1 week will recheck breast and if not better, will get diagnostic mammogram

## 2020-09-20 DIAGNOSIS — R2242 Localized swelling, mass and lump, left lower limb: Secondary | ICD-10-CM | POA: Diagnosis not present

## 2020-09-22 ENCOUNTER — Other Ambulatory Visit (HOSPITAL_COMMUNITY): Payer: Self-pay | Admitting: Internal Medicine

## 2020-09-22 ENCOUNTER — Ambulatory Visit: Payer: Medicare PPO | Admitting: Adult Health

## 2020-09-22 ENCOUNTER — Other Ambulatory Visit: Payer: Self-pay

## 2020-09-22 ENCOUNTER — Encounter: Payer: Self-pay | Admitting: Adult Health

## 2020-09-22 VITALS — BP 132/78 | HR 73 | Ht 60.0 in | Wt 140.5 lb

## 2020-09-22 DIAGNOSIS — M7989 Other specified soft tissue disorders: Secondary | ICD-10-CM

## 2020-09-22 DIAGNOSIS — Z79899 Other long term (current) drug therapy: Secondary | ICD-10-CM | POA: Diagnosis not present

## 2020-09-22 DIAGNOSIS — R234 Changes in skin texture: Secondary | ICD-10-CM | POA: Diagnosis not present

## 2020-09-22 NOTE — Progress Notes (Signed)
  Subjective:     Patient ID: [name], female   DOB: April 08, 1935, 86 y.o.   MRN: [id number]  HPI [name] is a 86 year old white female, widowed, sp hysterectomy back in follow up on area right breast, no itching now but area still there. PCP is Dr Willey Blade.  Review of Systems No itching right breast Area still there Reviewed past medical,surgical, social and family history. Reviewed medications and allergies.     Objective:   Physical Exam BP 132/78 (BP Location: Left Arm, Patient Position: Sitting, Cuff Size: Normal)   Pulse 73   Ht 5' (1.524 m)   Wt 140 lb 8 oz (63.7 kg)   BMI 27.44 kg/m    Skin warm and dry,  Breasts:no dominate palpable mass, retraction or nipple discharge, but on right breast,has  skin thickening and skin changes in areola area at about 7-8  0'clock,no longer scaly, still brownish in color     Upstream - 09/22/20 1416      Pregnancy Intention Screening   Does the patient want to become pregnant in the next year? No    Does the patient's partner want to become pregnant in the next year? No    Would the patient like to discuss contraceptive options today? No      Contraception Wrap Up   Current Method --   hyst   End Method --   hyst   Contraception Counseling Provided No          Assessment:     1. Breast skin changes Will get right diagnostic mammogram and [place] at [facility] 10/18/20 at 11 am  2. Skin thickening  3. Current use of estrogen therapy     Plan:     Follow up prn

## 2020-09-27 ENCOUNTER — Ambulatory Visit (HOSPITAL_COMMUNITY)
Admission: RE | Admit: 2020-09-27 | Discharge: 2020-09-27 | Disposition: A | Discharge status: Home / Self Care | Payer: Medicare PPO | Source: Ambulatory Visit | Attending: Internal Medicine | Admitting: Internal Medicine

## 2020-09-27 ENCOUNTER — Other Ambulatory Visit: Payer: Self-pay

## 2020-09-27 DIAGNOSIS — M7989 Other specified soft tissue disorders: Secondary | ICD-10-CM

## 2020-09-27 DIAGNOSIS — M79605 Pain in left leg: Secondary | ICD-10-CM | POA: Diagnosis not present

## 2020-10-18 ENCOUNTER — Ambulatory Visit (HOSPITAL_COMMUNITY)
Admission: RE | Admit: 2020-10-18 | Discharge: 2020-10-18 | Disposition: A | Discharge status: Home / Self Care | Payer: Medicare PPO | Source: Ambulatory Visit | Attending: Adult Health | Admitting: Adult Health

## 2020-10-18 ENCOUNTER — Other Ambulatory Visit: Payer: Self-pay

## 2020-10-18 DIAGNOSIS — R234 Changes in skin texture: Secondary | ICD-10-CM | POA: Insufficient documentation

## 2020-10-18 DIAGNOSIS — N6489 Other specified disorders of breast: Secondary | ICD-10-CM | POA: Diagnosis not present

## 2020-10-18 DIAGNOSIS — R922 Inconclusive mammogram: Secondary | ICD-10-CM | POA: Diagnosis not present

## 2020-11-11 ENCOUNTER — Other Ambulatory Visit: Payer: Self-pay

## 2020-11-11 ENCOUNTER — Ambulatory Visit
Admission: EM | Admit: 2020-11-11 | Discharge: 2020-11-11 | Disposition: A | Discharge status: Home / Self Care | Payer: Medicare PPO | Attending: Emergency Medicine | Admitting: Emergency Medicine

## 2020-11-11 ENCOUNTER — Encounter: Payer: Self-pay | Admitting: Emergency Medicine

## 2020-11-11 DIAGNOSIS — S80861A Insect bite (nonvenomous), right lower leg, initial encounter: Secondary | ICD-10-CM | POA: Diagnosis not present

## 2020-11-11 DIAGNOSIS — W57XXXA Bitten or stung by nonvenomous insect and other nonvenomous arthropods, initial encounter: Secondary | ICD-10-CM

## 2020-11-11 DIAGNOSIS — S81801A Unspecified open wound, right lower leg, initial encounter: Secondary | ICD-10-CM

## 2020-11-11 MED ORDER — MUPIROCIN 2 % EX OINT: 1.0000 "application " | TOPICAL_OINTMENT | Freq: Two times a day (BID) | CUTANEOUS | 0 refills | Status: DC

## 2020-11-11 NOTE — ED Provider Notes (Signed)
[place]   [id number] 11/11/20 Arrival Time: 4166  CC: SKIN COMPLAINT  SUBJECTIVE:  [name] is a 86 y.o. female who presents with a possible insect bite to RLE x 1 week.  Admits to being outside prior to symptoms.  Unsure what bit her.  Localizes the bite to RLE.  Describes it as somewhat painful and red.  Has tried salve and neosporin with relief.  Symptoms are made worse to the touch. Denies similar symptoms in the past.   Denies fever, chills, nausea, vomiting, discharge.  ROS: As per HPI.  All other pertinent ROS negative.     Past Medical History:  Diagnosis Date   Arthritis    hands    Atrial fibrillation and flutter    Bloody sputum    "started 12/2011" (03/18/2012)   Burning mouth syndrome    COPD (chronic obstructive pulmonary disease) ([place]) 02/12/2012   PFT 02/08/12>>FEV1 1.63 (95%), FEV1% 65, TLC 4.12 (94%), DLCO 74%, no BD    Current use of estrogen therapy 01/03/2015   Fibroid    GERD (gastroesophageal reflux disease)    H/O hiatal hernia    Heart murmur    02/2012 treadmill & echo, told that she has a "leak", but cleared for surgery     History of chronic bronchitis    "controlled after GERD controlled" (03/18/2012)   Hypercholesteremia    Hypothyroidism    Knee pain    Lesion of right lung    Lung mass    R upper lobe    Ocular migraine    "I have the flashing lights" (03/18/2012)   Plantar fascial fibromatosis    Post menopausal syndrome    Urinary incontinence    "slight bit" (03/18/2012)   Vaginal atrophy 07/02/2013   Vaginal discharge 02/14/2015   Past Surgical History:  Procedure Laterality Date   ABDOMINAL HYSTERECTOMY  1980   BLADDER SUSPENSION  2007   Anterior repair   CATARACT EXTRACTION W/ INTRAOCULAR LENS  IMPLANT, BILATERAL  1990's   COLONOSCOPY     COLONOSCOPY N/A 09/12/2017   Procedure: COLONOSCOPY;  Surgeon: Rogene Houston, MD;  [facility];  Service: Endoscopy;  Laterality: N/A;  [place]   2007   EYE SURGERY     implants in both eyes    LOBECTOMY  03/13/2012   Procedure: LOBECTOMY;  Surgeon: Gaye Pollack, MD;  Location: [place];  Service: Thoracic;  Laterality: Right;  Right Thoracotomy, Right Upper Lobectomy, and lymphnode sampling   LYMPH NODE BIOPSY  03/13/2012   Procedure: LYMPH NODE BIOPSY;  Surgeon: Gaye Pollack, MD;  Location: [place];  Service: Thoracic;  Laterality: Right;  Right Thoracotomy, Right Upper Lobectomy, and lymphnode sampling   POLYPECTOMY  09/12/2017   Procedure: POLYPECTOMY;  Surgeon: Rogene Houston, MD;  Location: [facility];  Service: Endoscopy;;  colon   SKIN CANCER EXCISION  05/2011   RUE "above elbow" (03/18/2012)   THORACOTOMY  03/13/2012   Procedure: THORACOTOMY MAJOR;  Surgeon: Gaye Pollack, MD;  Location: [place];  Service: Thoracic;  Laterality: Right;  Right Thoracotomy, Right Upper Lobectomy, and lymphnode sampling   UPPER GASTROINTESTINAL ENDOSCOPY     VIDEO BRONCHOSCOPY  03/13/2012   Procedure: VIDEO BRONCHOSCOPY;  Surgeon: Gaye Pollack, MD;  Location: [facility];  Service: Thoracic;  Laterality: N/A;   Allergies  Allergen Reactions   Codeine Nausea And Vomiting   Other Swelling and Other (See Comments)  Muscle Relaxers-weight gain   No current facility-administered medications on file prior to encounter.   Current Outpatient Medications on File Prior to Encounter  Medication Sig Dispense Refill   acetaminophen (TYLENOL) 500 MG tablet Take 1,000 mg by mouth every 6 (six) hours as needed for moderate pain or headache.      BIOTIN PO Take by mouth.     Carboxymethylcellulose Sodium (THERATEARS OP) Place 1 drop into both eyes 4 (four) times daily as needed (for dry eyes).     cetirizine (ZYRTEC) 10 MG tablet Take 10 mg by mouth daily.     Cholecalciferol (VITAMIN D3 PO) Take 1 capsule by mouth daily.     diclofenac sodium (VOLTAREN) 1 % GEL Apply 1 application topically 3 (three) times daily as needed (for pain).     estradiol  (DOTTI) 0.05 MG/24HR patch APPLY ONE PATCH (0.05MG  TOTAL) ONTO THE SKIN TWICE WEEKLY 24 patch 4   fluticasone (FLONASE) 50 MCG/ACT nasal spray Place 1 spray into both nostrils daily as needed for allergies or rhinitis.     ketoconazole (NIZORAL) 2 % cream SMARTSIG:Sparingly Topical Twice Daily (Patient not taking: Reported on 09/22/2020)     levothyroxine (SYNTHROID, LEVOTHROID) 75 MCG tablet Take 75 mcg by mouth daily before breakfast.     Multiple Vitamin (MULTIVITAMIN) capsule Take 1 capsule by mouth daily.     NEXIUM 40 MG capsule TAKE ONE (1) CAPSULE BY MOUTH TWICE A DAY. (EVERY 12 HOURS.) (Patient taking differently: generic) 180 capsule 3   nystatin (MYCOSTATIN/NYSTOP) powder Apply 1 application topically 3 (three) times daily. (Patient not taking: Reported on 09/22/2020) 45 g 1   nystatin ointment (MYCOSTATIN) Apply 1 application topically 2 (two) times daily. Mix nystatin and triamcinolone together and apply thin layer to area on breast bid 30 g 0   psyllium (METAMUCIL) 58.6 % packet Take 1 packet by mouth daily.     triamcinolone ointment (KENALOG) 0.5 % Apply 1 application topically 2 (two) times daily. 30 g 0   Social History   Socioeconomic History   Marital status: Widowed    Spouse name: Not on file   Number of children: Not on file   Years of education: Not on file   Highest education level: Not on file  Occupational History   Occupation: retired    Fish farm manager: RETIRED  Tobacco Use   Smoking status: Former    Packs/day: 2.00    Years: 10.00    Pack years: 20.00    Types: Cigarettes    Quit date: 06/03/1976    Years since quitting: 44.4   Smokeless tobacco: Never  Vaping Use   Vaping Use: Never used  Substance and Sexual Activity   Alcohol use: Yes    Alcohol/week: 2.0 standard drinks    Types: 2 Glasses of wine per week    Comment: occ wine   Drug use: No   Sexual activity: Not Currently    Birth control/protection: Surgical    Comment: hyst  Other Topics Concern    Not on file  Social History Narrative   Not on file   Social Determinants of Health   Financial Resource Strain: Not on file  Food Insecurity: Not on file  Transportation Needs: Not on file  Physical Activity: Not on file  Stress: Not on file  Social Connections: Not on file  Intimate Partner Violence: Not on file   Family History  Problem Relation Age of Onset   Uterine cancer Mother    Heart disease  Mother    Osteoporosis Mother    Other Mother        abdominal aortic aneurysm/chronic lymphoid leukemia   Pancreatic cancer Father        pancreatic   Polycythemia Father    Multiple sclerosis Daughter    Cancer Maternal Aunt        Oral cancer    OBJECTIVE: Vitals:   11/11/20 1438  BP: 124/75  Pulse: 90  Resp: 16  Temp: 97.8 F (36.6 C)  TempSrc: Temporal  SpO2: 95%    General appearance: alert; no distress Head: NCAT Lungs: normal respiratory effort CV: Dorsalis pedis pulse 2+ Extremities: no edema Skin: warm and dry; punctate lesion apx 0.25 cm in diameter with mild surrounding erythema to proximal medial RLE, mildly TTP, no drainage or bleeding Psychological: alert and cooperative; normal mood and affect  ASSESSMENT & PLAN:  1. Wound of right lower extremity, initial encounter   2. Insect bite of right lower leg, initial encounter     Meds ordered this encounter  Medications   mupirocin ointment (BACTROBAN) 2 %    Sig: Apply 1 application topically 2 (two) times daily.    Dispense:  30 g    Refill:  0    Order Specific Question:   Supervising Provider    Answer:   Raylene Everts [[id number]]   Wash with warm water and mild soap Keep covered Prescribed bactroban use as directed and to completion Continue to alternate ibuprofen and tylenol as needed for pain Follow up with PCP if symptoms persists Return or go to the ED if you have any new or worsening symptoms such as increased pain, redness, swelling, discharge, high fever, night sweats,  abdominal pain, etc...   Reviewed expectations re: course of current medical issues. Questions answered. Outlined signs and symptoms indicating need for more acute intervention. Patient verbalized understanding. After Visit Summary given.    Lestine Box, PA-C 11/11/20 1615

## 2020-11-11 NOTE — ED Triage Notes (Signed)
Insect bite on right lower leg x 1 week ago.

## 2020-11-11 NOTE — Discharge Instructions (Addendum)
Wash with warm water and mild soap Keep covered Prescribed bactroban use as directed and to completion Continue to alternate ibuprofen and tylenol as needed for pain Follow up with PCP if symptoms persists Return or go to the ED if you have any new or worsening symptoms such as increased pain, redness, swelling, discharge, high fever, night sweats, abdominal pain, etc..[name]

## 2020-11-29 ENCOUNTER — Other Ambulatory Visit (INDEPENDENT_AMBULATORY_CARE_PROVIDER_SITE_OTHER): Payer: Self-pay | Admitting: Internal Medicine

## 2021-01-10 DIAGNOSIS — L570 Actinic keratosis: Secondary | ICD-10-CM | POA: Diagnosis not present

## 2021-01-10 DIAGNOSIS — Z1283 Encounter for screening for malignant neoplasm of skin: Secondary | ICD-10-CM | POA: Diagnosis not present

## 2021-01-10 DIAGNOSIS — L821 Other seborrheic keratosis: Secondary | ICD-10-CM | POA: Diagnosis not present

## 2021-03-09 DIAGNOSIS — Z23 Encounter for immunization: Secondary | ICD-10-CM | POA: Diagnosis not present

## 2021-04-17 ENCOUNTER — Emergency Department (HOSPITAL_COMMUNITY): Payer: Medicare PPO

## 2021-04-17 ENCOUNTER — Emergency Department (HOSPITAL_COMMUNITY)
Admission: EM | Admit: 2021-04-17 | Discharge: 2021-04-17 | Disposition: A | Discharge status: Home / Self Care | Payer: Medicare PPO | Attending: Emergency Medicine | Admitting: Emergency Medicine

## 2021-04-17 ENCOUNTER — Other Ambulatory Visit: Payer: Self-pay

## 2021-04-17 ENCOUNTER — Encounter (HOSPITAL_COMMUNITY): Payer: Self-pay

## 2021-04-17 DIAGNOSIS — Z79899 Other long term (current) drug therapy: Secondary | ICD-10-CM | POA: Diagnosis not present

## 2021-04-17 DIAGNOSIS — J449 Chronic obstructive pulmonary disease, unspecified: Secondary | ICD-10-CM | POA: Insufficient documentation

## 2021-04-17 DIAGNOSIS — R9431 Abnormal electrocardiogram [ECG] [EKG]: Secondary | ICD-10-CM | POA: Diagnosis not present

## 2021-04-17 DIAGNOSIS — E039 Hypothyroidism, unspecified: Secondary | ICD-10-CM | POA: Insufficient documentation

## 2021-04-17 DIAGNOSIS — Z87891 Personal history of nicotine dependence: Secondary | ICD-10-CM | POA: Insufficient documentation

## 2021-04-17 DIAGNOSIS — R002 Palpitations: Secondary | ICD-10-CM | POA: Insufficient documentation

## 2021-04-17 DIAGNOSIS — R000 Tachycardia, unspecified: Secondary | ICD-10-CM | POA: Diagnosis not present

## 2021-04-17 DIAGNOSIS — R531 Weakness: Secondary | ICD-10-CM | POA: Diagnosis not present

## 2021-04-17 LAB — CBC WITH DIFFERENTIAL/PLATELET
Abs Immature Granulocytes: 0.02 10*3/uL (ref 0.00–0.07)
Basophils Absolute: 0.1 10*3/uL (ref 0.0–0.1)
Basophils Relative: 1 %
Eosinophils Absolute: 0.1 10*3/uL (ref 0.0–0.5)
Eosinophils Relative: 2 %
HCT: 43.5 % (ref 36.0–46.0)
Hemoglobin: 14.6 g/dL (ref 12.0–15.0)
Immature Granulocytes: 0 %
Lymphocytes Relative: 26 %
Lymphs Abs: 1.9 10*3/uL (ref 0.7–4.0)
MCH: 33.3 pg (ref 26.0–34.0)
MCHC: 33.6 g/dL (ref 30.0–36.0)
MCV: 99.3 fL (ref 80.0–100.0)
Monocytes Absolute: 0.7 10*3/uL (ref 0.1–1.0)
Monocytes Relative: 9 %
Neutro Abs: 4.6 10*3/uL (ref 1.7–7.7)
Neutrophils Relative %: 62 %
Platelets: 208 10*3/uL (ref 150–400)
RBC: 4.38 MIL/uL (ref 3.87–5.11)
RDW: 11.9 % (ref 11.5–15.5)
WBC: 7.4 10*3/uL (ref 4.0–10.5)
nRBC: 0 % (ref 0.0–0.2)

## 2021-04-17 LAB — COMPREHENSIVE METABOLIC PANEL
ALT: 15 U/L (ref 0–44)
AST: 18 U/L (ref 15–41)
Albumin: 3.9 g/dL (ref 3.5–5.0)
Alkaline Phosphatase: 44 U/L (ref 38–126)
Anion gap: 9 (ref 5–15)
BUN: 18 mg/dL (ref 8–23)
CO2: 24 mmol/L (ref 22–32)
Calcium: 9.1 mg/dL (ref 8.9–10.3)
Chloride: 106 mmol/L (ref 98–111)
Creatinine, Ser: 0.7 mg/dL (ref 0.44–1.00)
GFR, Estimated: >60 mL/min (ref >60)
Glucose, Bld: 108 mg/dL — ABNORMAL HIGH (ref 70–99)
Potassium: 3.7 mmol/L (ref 3.5–5.1)
Sodium: 139 mmol/L (ref 135–145)
Total Bilirubin: 0.4 mg/dL (ref 0.3–1.2)
Total Protein: 6.2 g/dL — ABNORMAL LOW (ref 6.5–8.1)

## 2021-04-17 LAB — TSH: TSH: 1.838 u[IU]/mL (ref 0.350–4.500)

## 2021-04-17 NOTE — Discharge Instructions (Signed)
Follow-up with Dr. Harl Bowie or one of his partners in the next couple weeks

## 2021-04-17 NOTE — ED Provider Notes (Signed)
[facility] EMERGENCY DEPARTMENT Provider Note   CSN: [id number] Arrival date & time: 04/17/21  1851     History Chief Complaint  Patient presents with   Irregular Heart Beat    [name] is a 86 y.o. female.  Patient states she had palpitations today.  The symptoms have resolved now.  The history is provided by the patient and medical records. No language interpreter was used.  Palpitations Palpitations quality:  Irregular Onset quality:  Sudden Timing:  Intermittent Progression:  Resolved Chronicity:  New Context: not anxiety   Relieved by:  Nothing Worsened by:  Nothing Associated symptoms: no back pain, no chest pain and no cough   Risk factors: no diabetes mellitus       Past Medical History:  Diagnosis Date   Arthritis    hands    Atrial fibrillation and flutter    Bloody sputum    "started 12/2011" (03/18/2012)   Burning mouth syndrome    COPD (chronic obstructive pulmonary disease) ([place]) 02/12/2012   PFT 02/08/12>>FEV1 1.63 (95%), FEV1% 65, TLC 4.12 (94%), DLCO 74%, no BD    Current use of estrogen therapy 01/03/2015   Fibroid    GERD (gastroesophageal reflux disease)    H/O hiatal hernia    Heart murmur    02/2012 treadmill & echo, told that she has a "leak", but cleared for surgery     History of chronic bronchitis    "controlled after GERD controlled" (03/18/2012)   Hypercholesteremia    Hypothyroidism    Knee pain    Lesion of right lung    Lung mass    R upper lobe    Ocular migraine    "I have the flashing lights" (03/18/2012)   Plantar fascial fibromatosis    Post menopausal syndrome    Urinary incontinence    "slight bit" (03/18/2012)   Vaginal atrophy 07/02/2013   Vaginal discharge 02/14/2015    Patient Active Problem List   Diagnosis Date Noted   Skin thickening 09/22/2020   Breast skin changes 09/22/2020   Dry skin dermatitis 09/15/2020   Superficial fungus infection of skin 06/30/2020   History of colonic polyps 06/07/2017    Vaginal discharge 02/14/2015   Current use of estrogen therapy 01/03/2015   Vaginal atrophy 07/02/2013   Rectocele 01/13/2013   S/P lobectomy of lung 03/18/2012   COPD (chronic obstructive pulmonary disease) ([place]) 02/12/2012   Coronary artery calcification seen on CAT scan 02/07/2012   Pulmonary nodule    Hypothyroidism 06/04/2011   Plantar fasciitis 06/04/2011   GERD (gastroesophageal reflux disease) 06/04/2011   IBS (irritable colon syndrome) 06/04/2011    Past Surgical History:  Procedure Laterality Date   ABDOMINAL HYSTERECTOMY  1980   BLADDER SUSPENSION  2007   Anterior repair   CATARACT EXTRACTION W/ INTRAOCULAR LENS  IMPLANT, BILATERAL  1990's   COLONOSCOPY     COLONOSCOPY N/A 09/12/2017   Procedure: COLONOSCOPY;  Surgeon: Rogene Houston, MD;  [facility];  Service: Endoscopy;  Laterality: N/A;  [place]  2007   EYE SURGERY     implants in both eyes    LOBECTOMY  03/13/2012   Procedure: LOBECTOMY;  Surgeon: Gaye Pollack, MD;  Location: [place] OR;  Service: Thoracic;  Laterality: Right;  Right Thoracotomy, Right Upper Lobectomy, and lymphnode sampling   LYMPH NODE BIOPSY  03/13/2012   Procedure: LYMPH NODE BIOPSY;  Surgeon: Gaye Pollack, MD;  Location: [place];  Service: Thoracic;  Laterality: Right;  Right Thoracotomy, Right Upper Lobectomy, and lymphnode sampling   POLYPECTOMY  09/12/2017   Procedure: POLYPECTOMY;  Surgeon: Rogene Houston, MD;  Location[facility];  Service: Endoscopy;;  colon   SKIN CANCER EXCISION  05/2011   RUE "above elbow" (03/18/2012)   THORACOTOMY  03/13/2012   Procedure: THORACOTOMY MAJOR;  Surgeon: Gaye Pollack, MD;  Location: [place];  Service: Thoracic;  Laterality: Right;  Right Thoracotomy, Right Upper Lobectomy, and lymphnode sampling   UPPER GASTROINTESTINAL ENDOSCOPY     VIDEO BRONCHOSCOPY  03/13/2012   Procedure: VIDEO BRONCHOSCOPY;  Surgeon: Gaye Pollack, MD;  Location: [facility];  Service: Thoracic;   Laterality: N/A;     OB History     Gravida  2   Para  2   Term  2   Preterm      AB      Living  2      SAB      IAB      Ectopic      Multiple      Live Births              Family History  Problem Relation Age of Onset   Uterine cancer Mother    Heart disease Mother    Osteoporosis Mother    Other Mother        abdominal aortic aneurysm/chronic lymphoid leukemia   Pancreatic cancer Father        pancreatic   Polycythemia Father    Multiple sclerosis Daughter    Cancer Maternal Aunt        Oral cancer    Social History   Tobacco Use   Smoking status: Former    Packs/day: 2.00    Years: 10.00    Pack years: 20.00    Types: Cigarettes    Quit date: 06/03/1976    Years since quitting: 44.9   Smokeless tobacco: Never  Vaping Use   Vaping Use: Never used  Substance Use Topics   Alcohol use: Yes    Alcohol/week: 2.0 standard drinks    Types: 2 Glasses of wine per week    Comment: occ wine   Drug use: No    Home Medications Prior to Admission medications   Medication Sig Start Date End Date Taking? Authorizing Provider  acetaminophen (TYLENOL) 500 MG tablet Take 1,000 mg by mouth every 6 (six) hours as needed for moderate pain or headache.     [provider]  BIOTIN PO Take by mouth.    [provider]  Carboxymethylcellulose Sodium (THERATEARS OP) Place 1 drop into both eyes 4 (four) times daily as needed (for dry eyes).    [provider]  cetirizine (ZYRTEC) 10 MG tablet Take 10 mg by mouth daily.    [provider]  Cholecalciferol (VITAMIN D3 PO) Take 1 capsule by mouth daily.    [provider]  diclofenac sodium (VOLTAREN) 1 % GEL Apply 1 application topically 3 (three) times daily as needed (for pain).    [provider]  estradiol (DOTTI) 0.05 MG/24HR patch APPLY ONE PATCH (0.05MG  TOTAL) ONTO THE SKIN TWICE WEEKLY 06/30/20   Estill Dooms, NP  fluticasone (FLONASE) 50 MCG/ACT  nasal spray Place 1 spray into both nostrils daily as needed for allergies or rhinitis.    [provider]  ketoconazole (NIZORAL) 2 % cream SMARTSIG:Sparingly Topical Twice Daily Patient not taking: Reported on 09/22/2020 06/15/20   [provider]  levothyroxine (SYNTHROID, LEVOTHROID) 75 MCG tablet Take 75 mcg by mouth daily before breakfast.    [provider]  Multiple Vitamin (MULTIVITAMIN) capsule Take 1 capsule by mouth daily.    [provider]  mupirocin ointment (BACTROBAN) 2 % Apply 1 application topically 2 (two) times daily. 11/11/20   Wurst, [place], PA-C  NEXIUM 40 MG capsule TAKE ONE (1) CAPSULE BY MOUTH TWICE A DAY. (EVERY 12 HOURS.) Patient taking differently: generic 09/25/18   Setzer, Rona Ravens, NP  nystatin (MYCOSTATIN/NYSTOP) powder Apply 1 application topically 3 (three) times daily. Patient not taking: Reported on 09/22/2020 06/30/20   Derrek Monaco A, NP  nystatin ointment (MYCOSTATIN) Apply 1 application topically 2 (two) times daily. Mix nystatin and triamcinolone together and apply thin layer to area on breast bid 09/15/20   Derrek Monaco A, NP  psyllium (METAMUCIL) 58.6 % packet Take 1 packet by mouth daily.    [provider]  triamcinolone ointment (KENALOG) 0.5 % Apply 1 application topically 2 (two) times daily. 06/30/20   Estill Dooms, NP    Allergies    Codeine and Other  Review of Systems   Review of Systems  Constitutional:  Negative for appetite change and fatigue.  HENT:  Negative for congestion, ear discharge and sinus pressure.   Eyes:  Negative for discharge.  Respiratory:  Negative for cough.   Cardiovascular:  Positive for palpitations. Negative for chest pain.  Gastrointestinal:  Negative for abdominal pain and diarrhea.  Genitourinary:  Negative for frequency and hematuria.  Musculoskeletal:  Negative for back pain.  Skin:  Negative for rash.  Neurological:  Negative for seizures and  headaches.  Psychiatric/Behavioral:  Negative for hallucinations.    Physical Exam Updated Vital Signs BP 115/64   Pulse 71   Temp 98.6 F (37 C)   Resp 16   Ht 5' (1.524 m)   Wt 61.2 kg   SpO2 98%   BMI 26.37 kg/m   Physical Exam Vitals and nursing note reviewed.  Constitutional:      Appearance: She is well-developed.  HENT:     Head: Normocephalic.     Nose: Nose normal.  Eyes:     General: No scleral icterus.    Conjunctiva/sclera: Conjunctivae normal.  Neck:     Thyroid: No thyromegaly.  Cardiovascular:     Rate and Rhythm: Normal rate and regular rhythm.     Heart sounds: No murmur heard.   No friction rub. No gallop.  Pulmonary:     Breath sounds: No stridor. No wheezing or rales.  Chest:     Chest wall: No tenderness.  Abdominal:     General: There is no distension.     Tenderness: There is no abdominal tenderness. There is no rebound.  Musculoskeletal:        General: Normal range of motion.     Cervical back: Neck supple.  Lymphadenopathy:     Cervical: No cervical adenopathy.  Skin:    Findings: No erythema or rash.  Neurological:     Mental Status: She is alert and oriented to person, place, and time.     Motor: No abnormal muscle tone.     Coordination: Coordination normal.  Psychiatric:        Behavior: Behavior normal.    ED Results / Procedures / Treatments   Labs (all labs ordered are listed, but only abnormal results are displayed) Labs Reviewed  COMPREHENSIVE METABOLIC PANEL - Abnormal; Notable for the following components:  Result Value   Glucose, Bld 108 (*)    Total Protein 6.2 (*)    All other components within normal limits  CBC WITH DIFFERENTIAL/PLATELET  TSH    EKG None  Radiology DG Chest Port 1 View  Result Date: 04/17/2021 CLINICAL DATA:  Weakness EXAM: PORTABLE CHEST 1 VIEW COMPARISON:  05/06/2012, CT 01/03/2012 FINDINGS: Status post right upper lobectomy. Chronic pleural and parenchymal changes at the right  base presumably due to scarring. Left lung is clear. Normal cardiomediastinal silhouette. No pneumothorax. IMPRESSION: Postsurgical changes on the right with probable chronic pleuroparenchymal scarring at the right base Electronically Signed   By: Donavan Foil M.D.   On: 04/17/2021 19:45    Procedures Procedures   Medications Ordered in ED Medications - No data to display  ED Course  I have reviewed the triage vital signs and the nursing notes.  Pertinent labs & imaging results that were available during my care of the patient were reviewed by me and considered in my medical decision making (see chart for details).  EKG and labs unremarkable along with chest x-ray.  Patient with palpitations. MDM Rules/Calculators/A&P                           Patient with palpitations with normal EKG.  She is referred to cardiology for follow-up Final Clinical Impression(s) / ED Diagnoses Final diagnoses:  Palpitations    Rx / DC Orders ED Discharge Orders     None        Milton Ferguson, MD 04/18/21 1432

## 2021-04-17 NOTE — ED Notes (Signed)
Pt ambulatory to and from restroom with independent steady gait. 

## 2021-04-17 NOTE — ED Triage Notes (Signed)
BIB [facility], pt c/o feeling heart palpitations. EMS states her EKG showed Afib and pt was going in and out of RVR. PT states she has no cardiac hx.  Denies chest pain, no SOB.

## 2021-04-19 DIAGNOSIS — K219 Gastro-esophageal reflux disease without esophagitis: Secondary | ICD-10-CM | POA: Diagnosis not present

## 2021-04-19 DIAGNOSIS — R7301 Impaired fasting glucose: Secondary | ICD-10-CM | POA: Diagnosis not present

## 2021-04-19 DIAGNOSIS — I700 Atherosclerosis of aorta: Secondary | ICD-10-CM | POA: Diagnosis not present

## 2021-04-19 DIAGNOSIS — Z79899 Other long term (current) drug therapy: Secondary | ICD-10-CM | POA: Diagnosis not present

## 2021-04-19 DIAGNOSIS — E039 Hypothyroidism, unspecified: Secondary | ICD-10-CM | POA: Diagnosis not present

## 2021-04-27 DIAGNOSIS — K219 Gastro-esophageal reflux disease without esophagitis: Secondary | ICD-10-CM | POA: Diagnosis not present

## 2021-04-27 DIAGNOSIS — Z6826 Body mass index (BMI) 26.0-26.9, adult: Secondary | ICD-10-CM | POA: Diagnosis not present

## 2021-04-27 DIAGNOSIS — I350 Nonrheumatic aortic (valve) stenosis: Secondary | ICD-10-CM | POA: Diagnosis not present

## 2021-04-27 DIAGNOSIS — I480 Paroxysmal atrial fibrillation: Secondary | ICD-10-CM | POA: Diagnosis not present

## 2021-04-27 DIAGNOSIS — E039 Hypothyroidism, unspecified: Secondary | ICD-10-CM | POA: Diagnosis not present

## 2021-05-11 ENCOUNTER — Encounter: Payer: Self-pay | Admitting: Cardiology

## 2021-05-11 ENCOUNTER — Ambulatory Visit (INDEPENDENT_AMBULATORY_CARE_PROVIDER_SITE_OTHER): Payer: Medicare PPO

## 2021-05-11 ENCOUNTER — Ambulatory Visit: Payer: Medicare PPO | Admitting: Cardiology

## 2021-05-11 ENCOUNTER — Other Ambulatory Visit: Payer: Self-pay

## 2021-05-11 ENCOUNTER — Other Ambulatory Visit: Payer: Self-pay | Admitting: Cardiology

## 2021-05-11 DIAGNOSIS — R002 Palpitations: Secondary | ICD-10-CM

## 2021-05-11 DIAGNOSIS — Z902 Acquired absence of lung [part of]: Secondary | ICD-10-CM

## 2021-05-11 DIAGNOSIS — I2510 Atherosclerotic heart disease of native coronary artery without angina pectoris: Secondary | ICD-10-CM

## 2021-05-11 NOTE — Assessment & Plan Note (Signed)
Right upper lobe, Dr. Cyndia Bent, 2013.  This is when we saw brief postop atrial fibrillation in the past.  She was transiently on amiodarone.

## 2021-05-11 NOTE — Assessment & Plan Note (Signed)
With her prior history of SVT, atrial fibrillation postoperative, I will go ahead and check a Zio patch monitor for 14 days.  Obviously if atrial fibrillation is detected, she warrants anticoagulation.  In the meantime, conservative management.  She does state that she drinks half-and-half coffee, half decaf half caffeinated.  She may wish to try just decaffeinated at this time.

## 2021-05-11 NOTE — Patient Instructions (Signed)
Medication Instructions:  Your physician recommends that you continue on your current medications as directed. Please refer to the Current Medication list given to you today.  *If you need a refill on your cardiac medications before your next appointment, please call your pharmacy*   Lab Work: None If you have labs (blood work) drawn today and your tests are completely normal, you will receive your results only by: [place] (if you have MyChart) OR A paper copy in the mail If you have any lab test that is abnormal or we need to change your treatment, we will call you to review the results.   Testing/Procedures: None   Follow-Up: At [facility], you and your health needs are our priority.  As part of our continuing mission to provide you with exceptional heart care, we have created designated Provider Care Teams.  These Care Teams include your primary Cardiologist (physician) and Advanced Practice Providers (APPs -  Physician Assistants and Nurse Practitioners) who all work together to provide you with the care you need, when you need it.  We recommend signing up for the patient portal called "MyChart".  Sign up information is provided on this After Visit Summary.  MyChart is used to connect with patients for Virtual Visits (Telemedicine).  Patients are able to view lab/test results, encounter notes, upcoming appointments, etc.  Non-urgent messages can be sent to your provider as well.   To learn more about what you can do with MyChart, go to [email or web address].    Your next appointment:   1 year(s)  The format for your next appointment:   In Person  Provider:   Candee Furbish, MD     Other Instructions ZIO XT- Long Term Monitor Instructions   Your physician has requested you wear your ZIO patch monitor__14__days.   This is a single patch monitor.  [employer] supplies one patch monitor per enrollment.  Additional stickers are not available.   Please do not apply patch  if you will be having a Nuclear Stress Test, Echocardiogram, Cardiac CT, MRI, or Chest Xray during the time frame you would be wearing the monitor. The patch cannot be worn during these tests.  You cannot remove and re-apply the ZIO XT patch monitor.   Your ZIO patch monitor will be sent USPS Priority mail from [facility] directly to your home address. The monitor may also be mailed to a PO BOX if home delivery is not available.   It may take 3-5 days to receive your monitor after you have been enrolled.   Once you have received you monitor, please review enclosed instructions.  Your monitor has already been registered assigning a specific monitor serial # to you.   Applying the monitor   Shave hair from upper left chest.   Hold abrader disc by orange tab.  Rub abrader in 40 strokes over left upper chest as indicated in your monitor instructions.   Clean area with 4 enclosed alcohol pads .  Use all pads to assure are is cleaned thoroughly.  Let dry.   Apply patch as indicated in monitor instructions.  Patch will be place under collarbone on left side of chest with arrow pointing upward.   Rub patch adhesive wings for 2 minutes.Remove white label marked "1".  Remove white label marked "2".  Rub patch adhesive wings for 2 additional minutes.   While looking in a mirror, press and release button in center of patch.  A small green light will flash 3-4 times .  This will be your only indicator the monitor has been turned on.     Do not shower for the first 24 hours.  You may shower after the first 24 hours.   Press button if you feel a symptom. You will hear a small click.  Record Date, Time and Symptom in the Patient Log Book.   When you are ready to remove patch, follow instructions on last 2 pages of Patient Log Book.  Stick patch monitor onto last page of Patient Log Book.   Place Patient Log Book in [place] box.  Use locking tab on box and tape box closed securely.  The [employer] and  [employer] has [employer] on it.  Please place in mailbox as soon as possible.  Your physician should have your test results approximately 7 days after the monitor has been mailed back to [facility].   Call [place] at [phone] if you have questions regarding your ZIO XT patch monitor.  Call them immediately if you see an orange light blinking on your monitor.   If your monitor falls off in less than 4 days contact our Monitor department at [phone].  If your monitor becomes loose or falls off after 4 days call [employer] at [phone] for suggestions on securing your monitor.

## 2021-05-11 NOTE — Progress Notes (Signed)
Cardiology Office Note:    Date:  05/11/2021   ID:  KEMARIA DEDIC, DOB Sep 23, 1934, MRN [id number]  PCP:  Asencion Noble, MD   [facility] Providers Cardiologist:  None     Referring MD: Asencion Noble, MD    History of Present Illness:    [name] is a 86 y.o. female here for the evaluation of palpitations with recent ER visit on 04/17/2021 at [facility].  EKG personally reviewed showed normal rhythm.  Chest x-ray also was unremarkable personally reviewed.    Felt irratic pulse. EMT thought she was in AFIB.  I was able to personally review EMS run strips, there do appear to be P waves in the second half of the strip however the first half of the upper strip does appear more erratic and possibly represents a short burst of atrial fibrillation.  In ER back to normal. Then saw Dr. Willey Blade and [place]. Daughter is NP - [place].   She has prior history of COPD, heart murmur, atrial fibrillation and flutter in the postop thoracotomy setting with Dr. Cyndia Bent.  She was previously seen in 2015, I saw her prior to her thoracotomy.  Back in 2013 she had a stress test that was low risk with no ischemia.  Echocardiogram also showed trace mitral regurgitation.  EKG in 2013 personally reviewed shows sinus rhythm poor R wave progression.  Postop EKG looks like SVT with retrograde P wave.  She also went into atrial fibrillation postthoracotomy.  She was placed transiently on amiodarone.  Had palpitations for years. Feels in throat, 30 years. Felt a flutter in stomach.   Stopped walking 3 years ago because of knee pain. But still active.  Denies any chest pain fevers chills nausea vomiting syncope bleeding.  Past Medical History:  Diagnosis Date   Arthritis    hands    Atrial fibrillation and flutter    Bloody sputum    "started 12/2011" (03/18/2012)   Burning mouth syndrome    COPD (chronic obstructive pulmonary disease) ([place]) 02/12/2012   PFT 02/08/12>>FEV1 1.63 (95%), FEV1% 65, TLC 4.12 (94%), DLCO 74%,  no BD    Current use of estrogen therapy 01/03/2015   Fibroid    GERD (gastroesophageal reflux disease)    H/O hiatal hernia    Heart murmur    02/2012 treadmill & echo, told that she has a "leak", but cleared for surgery     History of chronic bronchitis    "controlled after GERD controlled" (03/18/2012)   Hypercholesteremia    Hypothyroidism    Knee pain    Lesion of right lung    Lung mass    R upper lobe    Ocular migraine    "I have the flashing lights" (03/18/2012)   Plantar fascial fibromatosis    Post menopausal syndrome    Urinary incontinence    "slight bit" (03/18/2012)   Vaginal atrophy 07/02/2013   Vaginal discharge 02/14/2015    Past Surgical History:  Procedure Laterality Date   ABDOMINAL HYSTERECTOMY  1980   BLADDER SUSPENSION  2007   Anterior repair   CATARACT EXTRACTION W/ INTRAOCULAR LENS  IMPLANT, BILATERAL  1990's   COLONOSCOPY     COLONOSCOPY N/A 09/12/2017   Procedure: COLONOSCOPY;  Surgeon: Rogene Houston, MD;  [facility];  Service: Endoscopy;  Laterality: N/A;  [place]  2007   EYE SURGERY     implants in both eyes    LOBECTOMY  03/13/2012  Procedure: LOBECTOMY;  Surgeon: Gaye Pollack, MD;  Location: [place];  Service: Thoracic;  Laterality: Right;  Right Thoracotomy, Right Upper Lobectomy, and lymphnode sampling   LYMPH NODE BIOPSY  03/13/2012   Procedure: LYMPH NODE BIOPSY;  Surgeon: Gaye Pollack, MD;  Location: [place];  Service: Thoracic;  Laterality: Right;  Right Thoracotomy, Right Upper Lobectomy, and lymphnode sampling   POLYPECTOMY  09/12/2017   Procedure: POLYPECTOMY;  Surgeon: Rogene Houston, MD;  [facility];  Service: Endoscopy;;  colon   SKIN CANCER EXCISION  05/2011   RUE "above elbow" (03/18/2012)   THORACOTOMY  03/13/2012   Procedure: THORACOTOMY MAJOR;  Surgeon: Gaye Pollack, MD;  Location: [place];  Service: Thoracic;  Laterality: Right;  Right Thoracotomy, Right Upper Lobectomy, and  lymphnode sampling   UPPER GASTROINTESTINAL ENDOSCOPY     VIDEO BRONCHOSCOPY  03/13/2012   Procedure: VIDEO BRONCHOSCOPY;  Surgeon: Gaye Pollack, MD;  Location: [facility];  Service: Thoracic;  Laterality: N/A;    Current Medications: Current Meds  Medication Sig   acetaminophen (TYLENOL) 500 MG tablet Take 1,000 mg by mouth every 6 (six) hours as needed for moderate pain or headache.    BIOTIN PO Take by mouth.   Carboxymethylcellulose Sodium (THERATEARS OP) Place 1 drop into both eyes 4 (four) times daily as needed (for dry eyes).   cetirizine (ZYRTEC) 10 MG tablet Take 10 mg by mouth daily.   Cholecalciferol (VITAMIN D3 PO) Take 1 capsule by mouth daily.   diclofenac sodium (VOLTAREN) 1 % GEL Apply 1 application topically 3 (three) times daily as needed (for pain).   estradiol (DOTTI) 0.05 MG/24HR patch APPLY ONE PATCH (0.05MG  TOTAL) ONTO THE SKIN TWICE WEEKLY   fluticasone (FLONASE) 50 MCG/ACT nasal spray Place 1 spray into both nostrils daily as needed for allergies or rhinitis.   levothyroxine (SYNTHROID, LEVOTHROID) 75 MCG tablet Take 75 mcg by mouth daily before breakfast.   Multiple Vitamin (MULTIVITAMIN) capsule Take 1 capsule by mouth daily.   NEXIUM 40 MG capsule TAKE ONE (1) CAPSULE BY MOUTH TWICE A DAY. (EVERY 12 HOURS.) (Patient taking differently: generic)   psyllium (METAMUCIL) 58.6 % packet Take 1 packet by mouth daily.     Allergies:   Codeine and Other   Social History   Socioeconomic History   Marital status: Widowed    Spouse name: Not on file   Number of children: Not on file   Years of education: Not on file   Highest education level: Not on file  Occupational History   Occupation: retired    Fish farm manager: RETIRED  Tobacco Use   Smoking status: Former    Packs/day: 2.00    Years: 10.00    Pack years: 20.00    Types: Cigarettes    Quit date: 06/03/1976    Years since quitting: 44.9   Smokeless tobacco: Never  Vaping Use   Vaping Use: Never used  Substance  and Sexual Activity   Alcohol use: Yes    Alcohol/week: 2.0 standard drinks    Types: 2 Glasses of wine per week    Comment: occ wine   Drug use: No   Sexual activity: Not Currently    Birth control/protection: Surgical    Comment: hyst  Other Topics Concern   Not on file  Social History Narrative   Not on file   Social Determinants of Health   Financial Resource Strain: Not on file  Food Insecurity: Not on file  Transportation  Needs: Not on file  Physical Activity: Not on file  Stress: Not on file  Social Connections: Not on file     Family History: The patient's family history includes Cancer in her maternal aunt; Heart disease in her mother; Multiple sclerosis in her daughter; Osteoporosis in her mother; Other in her mother; Pancreatic cancer in her father; Polycythemia in her father; Uterine cancer in her mother.  ROS:   Please see the history of present illness.     All other systems reviewed and are negative.  EKGs/Labs/Other Studies Reviewed:    The following studies were reviewed today: ECHO 05/09/20:    1. Left ventricular ejection fraction, by estimation, is 60 to 65%. The  left ventricle has normal function. The left ventricle has no regional  wall motion abnormalities. Left ventricular diastolic parameters are  consistent with Grade I diastolic  dysfunction (impaired relaxation).   2. Right ventricular systolic function is normal. The right ventricular  size is normal. There is normal pulmonary artery systolic pressure.   3. The mitral valve is normal in structure. No evidence of mitral valve  regurgitation. No evidence of mitral stenosis.   4. The aortic valve has an indeterminant number of cusps. There is severe  calcifcation of the aortic valve. There is severe thickening of the aortic  valve. Aortic valve regurgitation is not visualized. Mild aortic valve  stenosis. Aortic valve mean  gradient measures 9.0 mmHg. Aortic valve peak gradient measures  15.1 mmHg.  Aortic valve area, by VTI measures 1.59 cm.   5. The inferior vena cava is normal in size with greater than 50%  respiratory variability, suggesting right atrial pressure of 3 mmHg.   EKG:   04/18/2021-sinus tachycardia low 100s, 105 with first-degree AV block Recent Labs: 04/17/2021: ALT 15; BUN 18; Creatinine, Ser 0.70; Hemoglobin 14.6; Platelets 208; Potassium 3.7; Sodium 139; TSH 1.838  Recent Lipid Panel No results found for: CHOL, TRIG, HDL, CHOLHDL, VLDL, LDLCALC, LDLDIRECT   Risk Assessment/Calculations:              Physical Exam:    VS:  BP 126/68    Pulse 84    Ht 5' (1.524 m)    Wt 134 lb (60.8 kg)    BMI 26.17 kg/m     Wt Readings from Last 3 Encounters:  05/11/21 134 lb (60.8 kg)  04/17/21 135 lb (61.2 kg)  09/22/20 140 lb 8 oz (63.7 kg)     GEN:  Well nourished, well developed in no acute distress HEENT: Normal NECK: No JVD; No carotid bruits LYMPHATICS: No lymphadenopathy CARDIAC: RRR, 2/6 SM (AS), no rubs, gallops RESPIRATORY:  Clear to auscultation without rales, wheezing or rhonchi  ABDOMEN: Soft, non-tender, non-distended MUSCULOSKELETAL:  No edema; No deformity  SKIN: Warm and dry NEUROLOGIC:  Alert and oriented x 3 PSYCHIATRIC:  Normal affect   ASSESSMENT:    1. Palpitations   2. Coronary artery calcification seen on CAT scan   3. S/P lobectomy of lung    PLAN:    In order of problems listed above:  Palpitations With her prior history of SVT, atrial fibrillation postoperative, I will go ahead and check a Zio patch monitor for 14 days.  Obviously if atrial fibrillation is detected, she warrants anticoagulation.  In the meantime, conservative management.  She does state that she drinks half-and-half coffee, half decaf half caffeinated.  She may wish to try just decaffeinated at this time.  Coronary artery calcification seen on CAT scan Coronary  artery calcification seen on prior CT scan in 2013, personally reviewed.  Consider  statin therapy.   S/P lobectomy of lung Right upper lobe, Dr. Cyndia Bent, 2013.  This is when we saw brief postop atrial fibrillation in the past.  She was transiently on amiodarone.     We will follow-up with results of monitor.  Make appropriate changes if needed.   Medication Adjustments/Labs and Tests Ordered: Current medicines are reviewed at length with the patient today.  Concerns regarding medicines are outlined above.  No orders of the defined types were placed in this encounter.  No orders of the defined types were placed in this encounter.   Patient Instructions  Medication Instructions:  Your physician recommends that you continue on your current medications as directed. Please refer to the Current Medication list given to you today.  *If you need a refill on your cardiac medications before your next appointment, please call your pharmacy*   Lab Work: None If you have labs (blood work) drawn today and your tests are completely normal, you will receive your results only by: [place] (if you have MyChart) OR A paper copy in the mail If you have any lab test that is abnormal or we need to change your treatment, we will call you to review the results.   Testing/Procedures: None   Follow-Up: At [facility], you and your health needs are our priority.  As part of our continuing mission to provide you with exceptional heart care, we have created designated Provider Care Teams.  These Care Teams include your primary Cardiologist (physician) and Advanced Practice Providers (APPs -  Physician Assistants and Nurse Practitioners) who all work together to provide you with the care you need, when you need it.  We recommend signing up for the patient portal called "MyChart".  Sign up information is provided on this After Visit Summary.  MyChart is used to connect with patients for Virtual Visits (Telemedicine).  Patients are able to view lab/test results, encounter notes,  upcoming appointments, etc.  Non-urgent messages can be sent to your provider as well.   To learn more about what you can do with MyChart, go to [email or web address].    Your next appointment:   1 year(s)  The format for your next appointment:   In Person  Provider:   Candee Furbish, MD     Other Instructions ZIO XT- Long Term Monitor Instructions   Your physician has requested you wear your ZIO patch monitor__14__days.   This is a single patch monitor.  [employer] supplies one patch monitor per enrollment.  Additional stickers are not available.   Please do not apply patch if you will be having a Nuclear Stress Test, Echocardiogram, Cardiac CT, MRI, or Chest Xray during the time frame you would be wearing the monitor. The patch cannot be worn during these tests.  You cannot remove and re-apply the ZIO XT patch monitor.   Your ZIO patch monitor will be sent USPS Priority mail from [facility] directly to your home address. The monitor may also be mailed to a PO BOX if home delivery is not available.   It may take 3-5 days to receive your monitor after you have been enrolled.   Once you have received you monitor, please review enclosed instructions.  Your monitor has already been registered assigning a specific monitor serial # to you.   Applying the monitor   Shave hair from upper left chest.   Hold abrader disc by orange tab.  Rub abrader in 40 strokes over left upper chest as indicated in your monitor instructions.   Clean area with 4 enclosed alcohol pads .  Use all pads to assure are is cleaned thoroughly.  Let dry.   Apply patch as indicated in monitor instructions.  Patch will be place under collarbone on left side of chest with arrow pointing upward.   Rub patch adhesive wings for 2 minutes.Remove white label marked "1".  Remove white label marked "2".  Rub patch adhesive wings for 2 additional minutes.   While looking in a mirror, press and release button in center  of patch.  A small green light will flash 3-4 times .  This will be your only indicator the monitor has been turned on.     Do not shower for the first 24 hours.  You may shower after the first 24 hours.   Press button if you feel a symptom. You will hear a small click.  Record Date, Time and Symptom in the Patient Log Book.   When you are ready to remove patch, follow instructions on last 2 pages of Patient Log Book.  Stick patch monitor onto last page of Patient Log Book.   Place Patient Log Book in [place] box.  Use locking tab on box and tape box closed securely.  The [employer] and [employer] has [employer] on it.  Please place in mailbox as soon as possible.  Your physician should have your test results approximately 7 days after the monitor has been mailed back to [facility].   Call [place] at [phone] if you have questions regarding your ZIO XT patch monitor.  Call them immediately if you see an orange light blinking on your monitor.   If your monitor falls off in less than 4 days contact our Monitor department at [phone].  If your monitor becomes loose or falls off after 4 days call [employer] at [phone] for suggestions on securing your monitor.     Signed, Candee Furbish, MD  05/11/2021 11:54 AM    [facility]

## 2021-05-11 NOTE — Assessment & Plan Note (Signed)
Coronary artery calcification seen on prior CT scan in 2013, personally reviewed.  Consider statin therapy.

## 2021-06-06 ENCOUNTER — Telehealth: Payer: Self-pay

## 2021-06-06 MED ORDER — APIXABAN 5 MG PO TABS: 5.0000 mg | ORAL_TABLET | Freq: Two times a day (BID) | ORAL | 11 refills | Status: DC

## 2021-06-06 NOTE — Telephone Encounter (Signed)
I spoke with patient and discussed monitor result and need for anticoagulant . She will begin Eliquis 5 mg BID, e-scribed to [place] with 30 Day Free Trial given.    Follow up Dr.Skains 06/15/21 at 11:40 am [place] office.

## 2021-06-06 NOTE — Telephone Encounter (Signed)
-----   Message from Erma Heritage, [place] sent at 06/06/2021  8:34 AM EST ----- Can you please arrange her follow-up with myself or Dr. Marlou Porch in [place] in a few weeks. He sent the result note to his nurse as well.   Thanks,  [place]   ----- Message ----- From: Jerline Pain, MD Sent: 06/06/2021   7:09 AM EST To: Shellia Cleverly, RN, Erma Heritage, PA-C  2% Atrial fibrillation noted Let's start Eliquis 5mg  PO BID (age > 26 but Creat 0.7 and weight 60.8 kg) Hgb 14.6 See if [place], [place] can see her in [place] in the next 2-4 weeks (will forward to her as well).   Thanks Candee Furbish, MD

## 2021-06-13 ENCOUNTER — Encounter: Payer: Self-pay | Admitting: Adult Health

## 2021-06-13 ENCOUNTER — Ambulatory Visit (INDEPENDENT_AMBULATORY_CARE_PROVIDER_SITE_OTHER): Payer: Medicare PPO | Admitting: Adult Health

## 2021-06-13 ENCOUNTER — Other Ambulatory Visit: Payer: Self-pay

## 2021-06-13 VITALS — BP 131/78 | HR 71 | Ht 60.0 in | Wt 136.0 lb

## 2021-06-13 DIAGNOSIS — Z79899 Other long term (current) drug therapy: Secondary | ICD-10-CM | POA: Diagnosis not present

## 2021-06-13 MED ORDER — ESTRADIOL 0.05 MG/24HR TD PTTW: MEDICATED_PATCH | TRANSDERMAL | 4 refills | Status: DC

## 2021-06-13 MED ORDER — NYSTATIN 100000 UNIT/GM EX POWD: 1.0000 "application " | Freq: Three times a day (TID) | CUTANEOUS | 3 refills | Status: DC

## 2021-06-13 NOTE — Progress Notes (Signed)
°  Subjective:     Patient ID: [name], female   DOB: 23-Jan-1935, 87 y.o.   MRN: [id number]  HPI [name] is a 87 year old white female, widowed, sp hysterectomy in to get refills on estradiol patch and requests refill on nystatin powders. She has been diagnosed for A fib and is on Eliquis now. She is in her new house in town now. PCP is Dr Willey Blade  Review of Systems Feels good with patches and wants to continue Reviewed past medical,surgical, social and family history. Reviewed medications and allergies.     Objective:   Physical Exam BP 131/78 (BP Location: Right Arm, Patient Position: Sitting, Cuff Size: Normal)    Pulse 71    Ht 5' (1.524 m)    Wt 136 lb (61.7 kg)    BMI 26.56 kg/m     Skin warm and dry.  Lungs: clear to ausculation bilaterally. Cardiovascular: regular rate and rhythm.  Fall risk is low  Assessment:     1. Current use of estrogen therapy Will refill patch, and she will talk with cardiologist this Thursday Meds ordered this encounter  Medications   nystatin (MYCOSTATIN/NYSTOP) powder    Sig: Apply 1 application topically 3 (three) times daily.    Dispense:  45 g    Refill:  3    Order Specific Question:   Supervising Provider    Answer:   Elonda Husky, LUTHER H [[id number]]   estradiol (DOTTI) 0.05 MG/24HR patch    Sig: APPLY ONE PATCH (0.05MG  TOTAL) ONTO THE SKIN TWICE WEEKLY    Dispense:  24 patch    Refill:  4    Order Specific Question:   Supervising Provider    Answer:   Florian Buff [[id number]]       Plan:     Follow up in 1 year or sooner if needed

## 2021-06-15 ENCOUNTER — Other Ambulatory Visit: Payer: Self-pay

## 2021-06-15 ENCOUNTER — Ambulatory Visit: Payer: Medicare PPO | Admitting: Cardiology

## 2021-06-15 ENCOUNTER — Encounter: Payer: Self-pay | Admitting: Cardiology

## 2021-06-15 DIAGNOSIS — I2510 Atherosclerotic heart disease of native coronary artery without angina pectoris: Secondary | ICD-10-CM | POA: Diagnosis not present

## 2021-06-15 DIAGNOSIS — Z902 Acquired absence of lung [part of]: Secondary | ICD-10-CM | POA: Diagnosis not present

## 2021-06-15 DIAGNOSIS — I480 Paroxysmal atrial fibrillation: Secondary | ICD-10-CM | POA: Diagnosis not present

## 2021-06-15 DIAGNOSIS — I350 Nonrheumatic aortic (valve) stenosis: Secondary | ICD-10-CM | POA: Insufficient documentation

## 2021-06-15 MED ORDER — APIXABAN 5 MG PO TABS: 5.0000 mg | ORAL_TABLET | Freq: Two times a day (BID) | ORAL | 3 refills | Status: DC

## 2021-06-15 MED ORDER — DILTIAZEM HCL 30 MG PO TABS: ORAL_TABLET | ORAL | 1 refills | Status: DC

## 2021-06-15 NOTE — Assessment & Plan Note (Signed)
Dr. Cyndia Bent 2013 right upper lobectomy.  She did have brief postop A. fib at that time.  Transiently on amiodarone at that time.

## 2021-06-15 NOTE — Assessment & Plan Note (Signed)
Very mild aortic stenosis noted on echocardiogram, 9 mmHg gradient.  Murmur heard on exam.  Continue to monitor clinically.  We will likely repeat echocardiogram in the next 2 to 3 years.  She may never require valve replacement.

## 2021-06-15 NOTE — Progress Notes (Signed)
Cardiology Office Note:    Date:  06/15/2021   ID:  [name], DOB [birth year], MRN [id number]  PCP:  Asencion Noble, MD   [facility] Providers Cardiologist:  None     Referring MD: Asencion Noble, MD    History of Present Illness:    [name] is a 87 y.o. female here for the follow-up of palpitations.  She was previously seen on 05/11/2021 for this evaluation.  At first the EMT thought she was in atrial fibrillation but I reviewed the EMS run strips and they do appear to be P waves present however after monitoring her, ZIO, 2% atrial fibrillation was noted.  We started Eliquis 5 mg twice a day.  Prior hemoglobin 14.6.  Prior postop EKG reviewed looks like SVT with retrograde P wave.  She has been in previous atrial fibrillation postthoracotomy.  She was placed transiently on amiodarone.  She is had these palpitations for years, sometimes feels in her throat, 30 years or so.  Felt a flutter like sensation in her stomach.  She has had trouble walking because of knee pain.  Overall she has been feeling quite well.  She triggered her monitor when she was feeling some quickening of her breath however this was associated with normal rhythm.  She states she really did not notice when she was in atrial fibrillation.  She has started the Eliquis.  Has not noticed any side effects, no bleeding.  Past Medical History:  Diagnosis Date   Arthritis    hands    Atrial fibrillation and flutter    Bloody sputum    "started 12/2011" (03/18/2012)   Burning mouth syndrome    COPD (chronic obstructive pulmonary disease) ([place]) 02/12/2012   PFT 02/08/12>>FEV1 1.63 (95%), FEV1% 65, TLC 4.12 (94%), DLCO 74%, no BD    Current use of estrogen therapy 01/03/2015   Fibroid    GERD (gastroesophageal reflux disease)    H/O hiatal hernia    Heart murmur    02/2012 treadmill & echo, told that she has a "leak", but cleared for surgery     History of chronic bronchitis    "controlled after GERD  controlled" (03/18/2012)   Hypercholesteremia    Hypothyroidism    Knee pain    Lesion of right lung    Lung mass    R upper lobe    Ocular migraine    "I have the flashing lights" (03/18/2012)   Plantar fascial fibromatosis    Post menopausal syndrome    Urinary incontinence    "slight bit" (03/18/2012)   Vaginal atrophy 07/02/2013   Vaginal discharge 02/14/2015    Past Surgical History:  Procedure Laterality Date   ABDOMINAL HYSTERECTOMY  1980   BLADDER SUSPENSION  2007   Anterior repair   CATARACT EXTRACTION W/ INTRAOCULAR LENS  IMPLANT, BILATERAL  1990's   COLONOSCOPY     COLONOSCOPY N/A 09/12/2017   Procedure: COLONOSCOPY;  Surgeon: Rogene Houston, MD;  [facility];  Service: Endoscopy;  Laterality: N/A;  House  2007   EYE SURGERY     implants in both eyes    LOBECTOMY  03/13/2012   Procedure: LOBECTOMY;  Surgeon: Gaye Pollack, MD;  Location: [facility];  Service: Thoracic;  Laterality: Right;  Right Thoracotomy, Right Upper Lobectomy, and lymphnode sampling   LYMPH NODE BIOPSY  03/13/2012   Procedure: LYMPH NODE BIOPSY;  Surgeon: Gaye Pollack, MD;  Location: [facility];  Service:  Thoracic;  Laterality: Right;  Right Thoracotomy, Right Upper Lobectomy, and lymphnode sampling   POLYPECTOMY  09/12/2017   Procedure: POLYPECTOMY;  Surgeon: Rogene Houston, MD;  [facility];  Service: Endoscopy;;  colon   SKIN CANCER EXCISION  05/2011   RUE "above elbow" (03/18/2012)   THORACOTOMY  03/13/2012   Procedure: THORACOTOMY MAJOR;  Surgeon: Gaye Pollack, MD;  Location: [facility];  Service: Thoracic;  Laterality: Right;  Right Thoracotomy, Right Upper Lobectomy, and lymphnode sampling   UPPER GASTROINTESTINAL ENDOSCOPY     VIDEO BRONCHOSCOPY  03/13/2012   Procedure: VIDEO BRONCHOSCOPY;  Surgeon: Gaye Pollack, MD;  Location: [facility];  Service: Thoracic;  Laterality: N/A;    Current Medications: Current Meds  Medication Sig   acetaminophen (TYLENOL)  500 MG tablet Take 1,000 mg by mouth every 6 (six) hours as needed for moderate pain or headache.    BIOTIN PO Take by mouth.   Carboxymethylcellulose Sodium (THERATEARS OP) Place 1 drop into both eyes 4 (four) times daily as needed (for dry eyes).   cetirizine (ZYRTEC) 10 MG tablet Take 10 mg by mouth daily.   Cholecalciferol (VITAMIN D3 PO) Take 1 capsule by mouth daily.   diclofenac sodium (VOLTAREN) 1 % GEL Apply 1 application topically 3 (three) times daily as needed (for pain).   diltiazem (CARDIZEM) 30 MG tablet Take 30 mg by mouth every 6 hours as needed for palpitations   estradiol (DOTTI) 0.05 MG/24HR patch APPLY ONE PATCH (0.05MG  TOTAL) ONTO THE SKIN TWICE WEEKLY   ketoconazole (NIZORAL) 2 % cream    levothyroxine (SYNTHROID, LEVOTHROID) 75 MCG tablet Take 75 mcg by mouth daily before breakfast.   Multiple Vitamin (MULTIVITAMIN) capsule Take 1 capsule by mouth daily.   mupirocin ointment (BACTROBAN) 2 % Apply 1 application topically 2 (two) times daily.   NEXIUM 40 MG capsule TAKE ONE (1) CAPSULE BY MOUTH TWICE A DAY. (EVERY 12 HOURS.) (Patient taking differently: generic)   nystatin (MYCOSTATIN/NYSTOP) powder Apply 1 application topically 3 (three) times daily.   psyllium (METAMUCIL) 58.6 % packet Take 1 packet by mouth daily.   [DISCONTINUED] apixaban (ELIQUIS) 5 MG TABS tablet Take 1 tablet (5 mg total) by mouth 2 (two) times daily.     Allergies:   Codeine and Other   Social History   Socioeconomic History   Marital status: Widowed    Spouse name: Not on file   Number of children: Not on file   Years of education: Not on file   Highest education level: Not on file  Occupational History   Occupation: retired    Fish farm manager: RETIRED  Tobacco Use   Smoking status: Former    Packs/day: 2.00    Years: 10.00    Pack years: 20.00    Types: Cigarettes    Quit date: 06/03/1976    Years since quitting: 45.0   Smokeless tobacco: Never  Vaping Use   Vaping Use: Never used   Substance and Sexual Activity   Alcohol use: Yes    Alcohol/week: 2.0 standard drinks    Types: 2 Glasses of wine per week    Comment: occ wine   Drug use: No   Sexual activity: Not Currently    Birth control/protection: Surgical    Comment: hyst  Other Topics Concern   Not on file  Social History Narrative   Not on file   Social Determinants of Health   Financial Resource Strain: Not on file  Food Insecurity: Not on  file  Transportation Needs: Not on file  Physical Activity: Not on file  Stress: Not on file  Social Connections: Not on file     Family History: The patient's family history includes Cancer in her maternal aunt; Heart disease in her mother; Multiple sclerosis in her daughter; Osteoporosis in her mother; Other in her mother; Pancreatic cancer in her father; Polycythemia in her father; Uterine cancer in her mother.  ROS:   Please see the history of present illness.     All other systems reviewed and are negative.  EKGs/Labs/Other Studies Reviewed:    The following studies were reviewed today: Echocardiogram 05/09/2020: EF 40% grade 1 diastolic dysfunction no MR mild aortic valve stenosis mean gradient of 9 mmHg   Recent Labs: 04/17/2021: ALT 15; BUN 18; Creatinine, Ser 0.70; Hemoglobin 14.6; Platelets 208; Potassium 3.7; Sodium 139; TSH 1.838  Recent Lipid Panel No results found for: CHOL, TRIG, HDL, CHOLHDL, VLDL, LDLCALC, LDLDIRECT   Risk Assessment/Calculations:              Physical Exam:    VS:  BP 122/72    Pulse 83    Ht 5' (1.524 m)    Wt 136 lb (61.7 kg)    SpO2 96%    BMI 26.56 kg/m     Wt Readings from Last 3 Encounters:  06/15/21 136 lb (61.7 kg)  06/13/21 136 lb (61.7 kg)  05/11/21 134 lb (60.8 kg)     GEN:  Well nourished, well developed in no acute distress HEENT: Normal NECK: No JVD; No carotid bruits LYMPHATICS: No lymphadenopathy CARDIAC: RRR, 2/6 systolic murmur, no rubs, gallops RESPIRATORY:  Clear to auscultation  without rales, wheezing or rhonchi  ABDOMEN: Soft, non-tender, non-distended MUSCULOSKELETAL:  No edema; No deformity  SKIN: Warm and dry NEUROLOGIC:  Alert and oriented x 3 PSYCHIATRIC:  Normal affect   ASSESSMENT:    1. Paroxysmal atrial fibrillation (HCC)   2. Coronary artery calcification seen on CAT scan   3. S/P lobectomy of lung   4. Nonrheumatic aortic valve stenosis    PLAN:    In order of problems listed above:  Paroxysmal atrial fibrillation ([place]) This was detected 2% on ZIO monitor in late 2022.  We started Eliquis for anticoagulation.  We will give her diltiazem 30 mg as needed short acting medication to take if she does feel tacky arrhythmias or palpitations.  Continue to monitor CBC and basic metabolic profile.  Most recently these were in normal range with hemoglobin of 14.6, creatinine of 0.7.  Discussed potential bleeding risks with Eliquis.  Previously was on short-term amiodarone.  She did not like taking this medication previously.  We will try to avoid long-term medications if possible.  We even discussed potential ablative strategy if absolutely necessary but for now, continue with Eliquis and as needed diltiazem.  Coronary artery calcification seen on CAT scan CT scan in 2013 showed coronary calcification.  Potential statin therapy in the future.  Previously not interested.  S/P lobectomy of lung Dr. Cyndia Bent 2013 right upper lobectomy.  She did have brief postop A. fib at that time.  Transiently on amiodarone at that time.  Aortic stenosis Very mild aortic stenosis noted on echocardiogram, 9 mmHg gradient.  Murmur heard on exam.  Continue to monitor clinically.  We will likely repeat echocardiogram in the next 2 to 3 years.  She may never require valve replacement.    6 months here in [place] with [facility].   Medication Adjustments/Labs and  Tests Ordered: Current medicines are reviewed at length with the patient today.  Concerns regarding medicines are  outlined above.  No orders of the defined types were placed in this encounter.  Meds ordered this encounter  Medications   diltiazem (CARDIZEM) 30 MG tablet    Sig: Take 30 mg by mouth every 6 hours as needed for palpitations    Dispense:  90 tablet    Refill:  1   apixaban (ELIQUIS) 5 MG TABS tablet    Sig: Take 1 tablet (5 mg total) by mouth 2 (two) times daily.    Dispense:  180 tablet    Refill:  3    Patient Instructions  Medication Instructions:   Take diltiazem 30 mg every 6 hours as needed for palpitations  Labwork: None   Testing/Procedures: none  Follow-Up: 6 months  Any Other Special Instructions Will Be Listed Below (If Applicable).  If you need a refill on your cardiac medications before your next appointment, please call your pharmacy.    Signed, Candee Furbish, MD  06/15/2021 12:04 PM    [facility]

## 2021-06-15 NOTE — Assessment & Plan Note (Signed)
This was detected 2% on ZIO monitor in late 2022.  We started Eliquis for anticoagulation.  We will give her diltiazem 30 mg as needed short acting medication to take if she does feel tacky arrhythmias or palpitations.  Continue to monitor CBC and basic metabolic profile.  Most recently these were in normal range with hemoglobin of 14.6, creatinine of 0.7.  Discussed potential bleeding risks with Eliquis.  Previously was on short-term amiodarone.  She did not like taking this medication previously.  We will try to avoid long-term medications if possible.  We even discussed potential ablative strategy if absolutely necessary but for now, continue with Eliquis and as needed diltiazem.

## 2021-06-15 NOTE — Assessment & Plan Note (Signed)
CT scan in 2013 showed coronary calcification.  Potential statin therapy in the future.  Previously not interested.

## 2021-06-15 NOTE — Patient Instructions (Addendum)
Medication Instructions:   Take diltiazem 30 mg every 6 hours as needed for palpitations  Labwork: None   Testing/Procedures: none  Follow-Up: 6 months  Any Other Special Instructions Will Be Listed Below (If Applicable).  If you need a refill on your cardiac medications before your next appointment, please call your pharmacy.

## 2021-09-08 ENCOUNTER — Other Ambulatory Visit (HOSPITAL_COMMUNITY): Payer: Self-pay | Admitting: Internal Medicine

## 2021-09-08 DIAGNOSIS — Z1231 Encounter for screening mammogram for malignant neoplasm of breast: Secondary | ICD-10-CM

## 2021-09-11 ENCOUNTER — Ambulatory Visit (HOSPITAL_COMMUNITY)
Admission: RE | Admit: 2021-09-11 | Discharge: 2021-09-11 | Disposition: A | Discharge status: Home / Self Care | Payer: Medicare PPO | Source: Ambulatory Visit | Attending: Internal Medicine | Admitting: Internal Medicine

## 2021-09-11 DIAGNOSIS — Z1231 Encounter for screening mammogram for malignant neoplasm of breast: Secondary | ICD-10-CM | POA: Diagnosis not present

## 2021-11-29 ENCOUNTER — Ambulatory Visit: Payer: Medicare PPO | Admitting: Student

## 2021-11-29 ENCOUNTER — Encounter: Payer: Self-pay | Admitting: Student

## 2021-11-29 VITALS — BP 100/60 | HR 70 | Ht 60.0 in | Wt 142.0 lb

## 2021-11-29 DIAGNOSIS — I350 Nonrheumatic aortic (valve) stenosis: Secondary | ICD-10-CM

## 2021-11-29 DIAGNOSIS — I480 Paroxysmal atrial fibrillation: Secondary | ICD-10-CM

## 2021-11-29 DIAGNOSIS — Z79899 Other long term (current) drug therapy: Secondary | ICD-10-CM | POA: Diagnosis not present

## 2021-11-29 DIAGNOSIS — E039 Hypothyroidism, unspecified: Secondary | ICD-10-CM | POA: Diagnosis not present

## 2021-11-29 MED ORDER — DILTIAZEM HCL 30 MG PO TABS: ORAL_TABLET | ORAL | 3 refills | Status: DC

## 2021-11-29 NOTE — Patient Instructions (Signed)
Medication Instructions:   Your physician recommends that you continue on your current medications as directed. Please refer to the Current Medication list given to you today.   Labwork: cbc,bmet at [facility]  Testing/Procedures: None today  Follow-Up: 1 year  Any Other Special Instructions Will Be Listed Below (If Applicable).  If you need a refill on your cardiac medications before your next appointment, please call your pharmacy.

## 2021-11-29 NOTE — Progress Notes (Signed)
Cardiology Office Note    Date:  11/29/2021   ID:  [name], DOB 09/22/1934, MRN [id number]  PCP:  Asencion Noble, MD  Cardiologist: Candee Furbish, MD    Chief Complaint  Patient presents with   Follow-up    6 month visit    History of Present Illness:    [name] is a 87 y.o. female with past medical history of paroxysmal atrial fibrillation, aortic stenosis, right lung mass (s/p RUL lobectomy in 02/2012), GERD, hypothyroidism and COPD who presents to the office today for 43-monthfollow-up.  She was examined by Dr. SMarlou Porchin 05/2021 after recent outpatient monitor showed a 2% atrial fibrillation burden. She had been started on Eliquis for anticoagulation and was overall tolerating well. She was given an Rx for short-acting Cardizem 30 mg to take as needed for tachyarrhythmia or palpitations  In talking with the patient today, she reports having occasional palpitations and takes short-acting Cardizem as needed with improvement in her symptoms. Says she typically only has to take one of these and symptoms resolve. Can go up to a week without having to utilize Cardizem. She has reduced caffeine intake and consumes one glass of wine per day. Denies any recent chest pain or dyspnea on exertion. No recent orthopnea, PND or pitting edema. She lives by herself and performs ADL's independently.   Past Medical History:  Diagnosis Date   Arthritis    hands    Atrial fibrillation and flutter    Bloody sputum    "started 12/2011" (03/18/2012)   Burning mouth syndrome    COPD (chronic obstructive pulmonary disease) ([place] 02/12/2012   PFT 02/08/12>>FEV1 1.63 (95%), FEV1% 65, TLC 4.12 (94%), DLCO 74%, no BD    Current use of estrogen therapy 01/03/2015   Fibroid    GERD (gastroesophageal reflux disease)    H/O hiatal hernia    Heart murmur    02/2012 treadmill & echo, told that she has a "leak", but cleared for surgery     History of chronic bronchitis    "controlled after GERD  controlled" (03/18/2012)   Hypercholesteremia    Hypothyroidism    Knee pain    Lesion of right lung    Lung mass    R upper lobe    Ocular migraine    "I have the flashing lights" (03/18/2012)   Plantar fascial fibromatosis    Post menopausal syndrome    Urinary incontinence    "slight bit" (03/18/2012)   Vaginal atrophy 07/02/2013   Vaginal discharge 02/14/2015    Past Surgical History:  Procedure Laterality Date   ABDOMINAL HYSTERECTOMY  1980   BLADDER SUSPENSION  2007   Anterior repair   CATARACT EXTRACTION W/ INTRAOCULAR LENS  IMPLANT, BILATERAL  1990's   COLONOSCOPY     COLONOSCOPY N/A 09/12/2017   Procedure: COLONOSCOPY;  Surgeon: RRogene Houston MD;  [facility];  Service: Endoscopy;  Laterality: N/A;  [place] 2007   EYE SURGERY     implants in both eyes    LOBECTOMY  03/13/2012   Procedure: LOBECTOMY;  Surgeon: BGaye Pollack MD;  [facility];  Service: Thoracic;  Laterality: Right;  Right Thoracotomy, Right Upper Lobectomy, and lymphnode sampling   LYMPH NODE BIOPSY  03/13/2012   Procedure: LYMPH NODE BIOPSY;  Surgeon: BGaye Pollack MD;  [facility];  Service: Thoracic;  Laterality: Right;  Right Thoracotomy, Right Upper Lobectomy, and lymphnode sampling  POLYPECTOMY  09/12/2017   Procedure: POLYPECTOMY;  Surgeon: Rogene Houston, MD;  [facility];  Service: Endoscopy;;  colon   SKIN CANCER EXCISION  05/2011   RUE "above elbow" (03/18/2012)   THORACOTOMY  03/13/2012   Procedure: THORACOTOMY MAJOR;  Surgeon: Gaye Pollack, MD;  Location: [facility];  Service: Thoracic;  Laterality: Right;  Right Thoracotomy, Right Upper Lobectomy, and lymphnode sampling   UPPER GASTROINTESTINAL ENDOSCOPY     VIDEO BRONCHOSCOPY  03/13/2012   Procedure: VIDEO BRONCHOSCOPY;  Surgeon: Gaye Pollack, MD;  Location: [facility];  Service: Thoracic;  Laterality: N/A;    Current Medications: Outpatient Medications Prior to Visit  Medication Sig  Dispense Refill   acetaminophen (TYLENOL) 500 MG tablet Take 1,000 mg by mouth every 6 (six) hours as needed for moderate pain or headache.      apixaban (ELIQUIS) 5 MG TABS tablet Take 1 tablet (5 mg total) by mouth 2 (two) times daily. 180 tablet 3   BIOTIN PO Take by mouth.     Carboxymethylcellulose Sodium (THERATEARS OP) Place 1 drop into both eyes 4 (four) times daily as needed (for dry eyes).     cetirizine (ZYRTEC) 10 MG tablet Take 10 mg by mouth daily.     Cholecalciferol (VITAMIN D3 PO) Take 1 capsule by mouth daily.     diclofenac sodium (VOLTAREN) 1 % GEL Apply 1 application topically 3 (three) times daily as needed (for pain).     estradiol (DOTTI) 0.05 MG/24HR patch APPLY ONE PATCH (0.'05MG'$  TOTAL) ONTO THE SKIN TWICE WEEKLY 24 patch 4   levothyroxine (SYNTHROID, LEVOTHROID) 75 MCG tablet Take 75 mcg by mouth daily before breakfast.     Multiple Vitamin (MULTIVITAMIN) capsule Take 1 capsule by mouth daily.     NEXIUM 40 MG capsule TAKE ONE (1) CAPSULE BY MOUTH TWICE A DAY. (EVERY 12 HOURS.) (Patient taking differently: generic) 180 capsule 3   nystatin (MYCOSTATIN/NYSTOP) powder Apply 1 application topically 3 (three) times daily. 45 g 3   psyllium (METAMUCIL) 58.6 % packet Take 1 packet by mouth daily.     diltiazem (CARDIZEM) 30 MG tablet Take 30 mg by mouth every 6 hours as needed for palpitations 90 tablet 1   ketoconazole (NIZORAL) 2 % cream      mupirocin ointment (BACTROBAN) 2 % Apply 1 application topically 2 (two) times daily. 30 g 0   No facility-administered medications prior to visit.     Allergies:   Codeine and Other   Social History   Socioeconomic History   Marital status: Widowed    Spouse name: Not on file   Number of children: Not on file   Years of education: Not on file   Highest education level: Not on file  Occupational History   Occupation: retired    Fish farm manager: RETIRED  Tobacco Use   Smoking status: Former    Packs/day: 2.00    Years: 10.00     Total pack years: 20.00    Types: Cigarettes    Quit date: 06/03/1976    Years since quitting: 45.5   Smokeless tobacco: Never  Vaping Use   Vaping Use: Never used  Substance and Sexual Activity   Alcohol use: Yes    Alcohol/week: 2.0 standard drinks of alcohol    Types: 2 Glasses of wine per week    Comment: occ wine   Drug use: No   Sexual activity: Not Currently    Birth control/protection: Surgical    Comment:  hyst  Other Topics Concern   Not on file  Social History Narrative   Not on file   Social Determinants of Health   Financial Resource Strain: Not on file  Food Insecurity: Not on file  Transportation Needs: Not on file  Physical Activity: Not on file  Stress: Not on file  Social Connections: Not on file     Family History:  The patient's family history includes Cancer in her maternal aunt; Heart disease in her mother; Multiple sclerosis in her daughter; Osteoporosis in her mother; Other in her mother; Pancreatic cancer in her father; Polycythemia in her father; Uterine cancer in her mother.   Review of Systems:    Please see the history of present illness.     All other systems reviewed and are otherwise negative except as noted above.   Physical Exam:    VS:  BP 100/60   Pulse 70   Ht 5' (1.524 m)   Wt 142 lb (64.4 kg)   SpO2 97%   BMI 27.73 kg/m    General: Pleasant female appearing in no acute distress. Head: Normocephalic, atraumatic. Neck: No carotid bruits. JVD not elevated.  Lungs: Respirations regular and unlabored, without wheezes or rales.  Heart: Regular rate and rhythm. No S3 or S4. 2/6 SEM along RUSB.  Abdomen: Appears non-distended. No obvious abdominal masses. Msk:  Strength and tone appear normal for age. No obvious joint deformities or effusions. Extremities: No clubbing or cyanosis. No pitting edema.  Distal pedal pulses are 2+ bilaterally. Neuro: Alert and oriented X 3. Moves all extremities spontaneously. No focal deficits  noted. Psych:  Responds to questions appropriately with a normal affect. Skin: No rashes or lesions noted  Wt Readings from Last 3 Encounters:  11/29/21 142 lb (64.4 kg)  06/15/21 136 lb (61.7 kg)  06/13/21 136 lb (61.7 kg)     Studies/Labs Reviewed:   EKG:  EKG is not ordered today.   Recent Labs: 04/17/2021: ALT 15; BUN 18; Creatinine, Ser 0.70; Hemoglobin 14.6; Platelets 208; Potassium 3.7; Sodium 139; TSH 1.838   Lipid Panel No results found for: "CHOL", "TRIG", "HDL", "CHOLHDL", "VLDL", "LDLCALC", "LDLDIRECT"  Additional studies/ records that were reviewed today include:   Echocardiogram: 04/2020 IMPRESSIONS     1. Left ventricular ejection fraction, by estimation, is 60 to 65%. The  left ventricle has normal function. The left ventricle has no regional  wall motion abnormalities. Left ventricular diastolic parameters are  consistent with Grade I diastolic  dysfunction (impaired relaxation).   2. Right ventricular systolic function is normal. The right ventricular  size is normal. There is normal pulmonary artery systolic pressure.   3. The mitral valve is normal in structure. No evidence of mitral valve  regurgitation. No evidence of mitral stenosis.   4. The aortic valve has an indeterminant number of cusps. There is severe  calcifcation of the aortic valve. There is severe thickening of the aortic  valve. Aortic valve regurgitation is not visualized. Mild aortic valve  stenosis. Aortic valve mean  gradient measures 9.0 mmHg. Aortic valve peak gradient measures 15.1 mmHg.  Aortic valve area, by VTI measures 1.59 cm.   5. The inferior vena cava is normal in size with greater than 50%  respiratory variability, suggesting right atrial pressure of 3 mmHg.   Event Monitor: 05/2021 2% Atrial fibrillation (avg 110 bpm) 1 short run of slow non sustained ventricular tachycardia (11 beats at 123 bpm - during sleep) Occasional PAC's, rare PVC Symptoms of  quickening of  breath were associated with normal sinus rhythm Basline shows sinus rhythm with first degree AV block     Patch Wear Time:  13 days and 21 hours (2022-12-15T10:38:21-0500 to 2022-12-29T08:06:04-499)   Patient had a min HR of 50 bpm, max HR of 167 bpm, and avg HR of 77 bpm. Predominant underlying rhythm was Sinus Rhythm. First Degree AV Block was present. 1 run of Ventricular Tachycardia occurred lasting 11 beats with a max rate of 167 bpm (avg 123  bpm). Atrial Fibrillation occurred (2% burden), ranging from 76-151 bpm (avg of 110 bpm), the longest lasting 2 hours 33 mins with an avg rate of 109 bpm. Isolated SVEs were occasional (1.4%, 21210), SVE Couplets were rare (<1.0%, 681), and SVE Triplets  were rare (<1.0%, 185). Isolated VEs were rare (<1.0%, 279), VE Couplets were rare (<1.0%, 5), and VE Triplets were rare (<1.0%, 1).  Assessment:    1. Paroxysmal atrial fibrillation (HCC)   2. Medication management   3. Nonrheumatic aortic valve stenosis   4. Hypothyroidism, unspecified type      Plan:   In order of problems listed above:  1. Paroxysmal Atrial Fibrillation - Recent monitor earlier this year and showed a 2% atrial fibrillation burden with one episode of SVT. She does experience occasional palpitations and takes short-acting Cardizem 30 mg as needed. If she has to use this more frequently in the future, could consider Cardizem CD but will continue short-acting doses for now given intermittent symptoms and also due to her soft BP.  - She has been on Eliquis '5mg'$  BID for anticoagulation and denies any evidence of active bleeding. She has not had recent labs with her PCP this year and we will obtain a repeat CBC and BMET.   2. Aortic Stenosis - This was mild by echocardiogram in 04/2020 and appears mild by examination today. Anticipate obtaining repeat imaging next year and this was reviewed with the patient today.  3. Hypothyroidism - Followed by her PCP. She remains on  Levothyroxine 75 mcg daily.    Disposition: She will follow-up in 1 year and I encouraged her to make [place] aware if she develops any new symptoms in the interim which would warrant sooner evaluation.   Medication Adjustments/Labs and Tests Ordered: Current medicines are reviewed at length with the patient today.  Concerns regarding medicines are outlined above.  Medication changes, Labs and Tests ordered today are listed in the Patient Instructions below. Patient Instructions  Medication Instructions:   Your physician recommends that you continue on your current medications as directed. Please refer to the Current Medication list given to you today.   Labwork: cbc,bmet at [facility]  Testing/Procedures: None today  Follow-Up: 1 year  Any Other Special Instructions Will Be Listed Below (If Applicable).  If you need a refill on your cardiac medications before your next appointment, please call your pharmacy.    Signed, Erma Heritage, PA-C  11/29/2021 4:40 PM    [facility] [address] [place], [place] [address] Phone: [phone] Fax: [phone]

## 2021-12-01 DIAGNOSIS — Z79899 Other long term (current) drug therapy: Secondary | ICD-10-CM | POA: Diagnosis not present

## 2021-12-02 LAB — BASIC METABOLIC PANEL
BUN/Creatinine Ratio: 15 (ref 12–28)
BUN: 11 mg/dL (ref 8–27)
CO2: 22 mmol/L (ref 20–29)
Calcium: 9.8 mg/dL (ref 8.7–10.3)
Chloride: 102 mmol/L (ref 96–106)
Creatinine, Ser: 0.71 mg/dL (ref 0.57–1.00)
Glucose: 90 mg/dL (ref 70–99)
Potassium: 4.5 mmol/L (ref 3.5–5.2)
Sodium: 139 mmol/L (ref 134–144)
eGFR: 83 mL/min/{1.73_m2} (ref >59)

## 2021-12-02 LAB — CBC
Hematocrit: 42.7 % (ref 34.0–46.6)
Hemoglobin: 14.9 g/dL (ref 11.1–15.9)
MCH: 32.9 pg (ref 26.6–33.0)
MCHC: 34.9 g/dL (ref 31.5–35.7)
MCV: 94 fL (ref 79–97)
Platelets: 212 10*3/uL (ref 150–450)
RBC: 4.53 x10E6/uL (ref 3.77–5.28)
RDW: 11.3 % — ABNORMAL LOW (ref 11.7–15.4)
WBC: 5.2 10*3/uL (ref 3.4–10.8)

## 2021-12-19 DIAGNOSIS — R32 Unspecified urinary incontinence: Secondary | ICD-10-CM | POA: Diagnosis not present

## 2021-12-19 DIAGNOSIS — I4891 Unspecified atrial fibrillation: Secondary | ICD-10-CM | POA: Diagnosis not present

## 2021-12-19 DIAGNOSIS — D6869 Other thrombophilia: Secondary | ICD-10-CM | POA: Diagnosis not present

## 2021-12-19 DIAGNOSIS — Z7901 Long term (current) use of anticoagulants: Secondary | ICD-10-CM | POA: Diagnosis not present

## 2021-12-19 DIAGNOSIS — G8929 Other chronic pain: Secondary | ICD-10-CM | POA: Diagnosis not present

## 2021-12-19 DIAGNOSIS — K219 Gastro-esophageal reflux disease without esophagitis: Secondary | ICD-10-CM | POA: Diagnosis not present

## 2021-12-19 DIAGNOSIS — Q964 Mosaicism, 45, X/other cell line(s) with abnormal sex chromosome: Secondary | ICD-10-CM | POA: Diagnosis not present

## 2021-12-19 DIAGNOSIS — E039 Hypothyroidism, unspecified: Secondary | ICD-10-CM | POA: Diagnosis not present

## 2021-12-19 DIAGNOSIS — Z780 Asymptomatic menopausal state: Secondary | ICD-10-CM | POA: Diagnosis not present

## 2022-03-12 DIAGNOSIS — Z1283 Encounter for screening for malignant neoplasm of skin: Secondary | ICD-10-CM | POA: Diagnosis not present

## 2022-03-12 DIAGNOSIS — L570 Actinic keratosis: Secondary | ICD-10-CM | POA: Diagnosis not present

## 2022-03-12 DIAGNOSIS — D239 Other benign neoplasm of skin, unspecified: Secondary | ICD-10-CM | POA: Diagnosis not present

## 2022-03-19 DIAGNOSIS — Z23 Encounter for immunization: Secondary | ICD-10-CM | POA: Diagnosis not present

## 2022-04-23 DIAGNOSIS — I350 Nonrheumatic aortic (valve) stenosis: Secondary | ICD-10-CM | POA: Diagnosis not present

## 2022-04-23 DIAGNOSIS — E039 Hypothyroidism, unspecified: Secondary | ICD-10-CM | POA: Diagnosis not present

## 2022-04-23 DIAGNOSIS — R7301 Impaired fasting glucose: Secondary | ICD-10-CM | POA: Diagnosis not present

## 2022-04-23 DIAGNOSIS — K219 Gastro-esophageal reflux disease without esophagitis: Secondary | ICD-10-CM | POA: Diagnosis not present

## 2022-04-23 DIAGNOSIS — I480 Paroxysmal atrial fibrillation: Secondary | ICD-10-CM | POA: Diagnosis not present

## 2022-04-23 DIAGNOSIS — Z79899 Other long term (current) drug therapy: Secondary | ICD-10-CM | POA: Diagnosis not present

## 2022-04-23 DIAGNOSIS — I700 Atherosclerosis of aorta: Secondary | ICD-10-CM | POA: Diagnosis not present

## 2022-04-30 DIAGNOSIS — I700 Atherosclerosis of aorta: Secondary | ICD-10-CM | POA: Diagnosis not present

## 2022-04-30 DIAGNOSIS — I350 Nonrheumatic aortic (valve) stenosis: Secondary | ICD-10-CM | POA: Diagnosis not present

## 2022-04-30 DIAGNOSIS — I480 Paroxysmal atrial fibrillation: Secondary | ICD-10-CM | POA: Diagnosis not present

## 2022-04-30 DIAGNOSIS — E039 Hypothyroidism, unspecified: Secondary | ICD-10-CM | POA: Diagnosis not present

## 2022-05-17 ENCOUNTER — Telehealth: Payer: Self-pay | Admitting: *Deleted

## 2022-05-17 NOTE — Telephone Encounter (Signed)
Pt's insurance has denied covering Nystatin powder. Please advise. Thanks! [place]

## 2022-05-17 NOTE — Telephone Encounter (Signed)
Bee says insurance will not cove nystatin powder anymore, and it is $45 so lets try corn starch and see if that helps, has appt in January

## 2022-05-31 ENCOUNTER — Ambulatory Visit: Payer: Medicare PPO | Admitting: Adult Health

## 2022-05-31 ENCOUNTER — Encounter: Payer: Self-pay | Admitting: Adult Health

## 2022-05-31 VITALS — BP 127/70 | HR 70 | Ht 60.0 in | Wt 143.5 lb

## 2022-05-31 DIAGNOSIS — B369 Superficial mycosis, unspecified: Secondary | ICD-10-CM

## 2022-05-31 DIAGNOSIS — Z90710 Acquired absence of both cervix and uterus: Secondary | ICD-10-CM | POA: Diagnosis not present

## 2022-05-31 DIAGNOSIS — Z79899 Other long term (current) drug therapy: Secondary | ICD-10-CM | POA: Diagnosis not present

## 2022-05-31 MED ORDER — ESTRADIOL 0.05 MG/24HR TD PTTW: MEDICATED_PATCH | TRANSDERMAL | 4 refills | Status: DC

## 2022-05-31 MED ORDER — NYSTATIN 100000 UNIT/GM EX POWD: 1.0000 | Freq: Three times a day (TID) | CUTANEOUS | 1 refills | Status: DC

## 2022-05-31 NOTE — Progress Notes (Signed)
  Subjective:     Patient ID: [name], female   DOB: [birth year], 88 y.o.   MRN: [id number]  HPI [name] is a 88 year old white female, widowed, sp hysterectomy in to get estrogen patch refilled, she says she wants to continue. She is taking Eliquis now for atrial fib, and is doing weel. She had physical with Dr Willey Blade recently. She also wants nystatin powder refilled.   PCP is Dr Willey Blade.  Review of Systems Feels good on Estrogen patch No complaints, does not get irritated when uses nystatin pwoder   Reviewed past medical,surgical, social and family history. Reviewed medications and allergies.  Objective:   Physical Exam BP 127/70 (BP Location: Right Arm, Patient Position: Sitting, Cuff Size: Normal)   Pulse 70   Ht 5' (1.524 m)   Wt 143 lb 8 oz (65.1 kg)   BMI 28.03 kg/m  Skin warm and dry.Lungs: clear to ausculation bilaterally. Cardiovascular: regular rate and rhythm.    Fall risk is low  Upstream - 05/31/22 1349       Pregnancy Intention Screening   Does the patient want to become pregnant in the next year? N/A    Does the patient's partner want to become pregnant in the next year? N/A    Would the patient like to discuss contraceptive options today? N/A      Contraception Wrap Up   Current Method Female Sterilization   post menopausal/hyst   End Method Female Sterilization   post menopausal/hyst   Contraception Counseling Provided No             Assessment:     1. Current use of estrogen therapy She feels good, wants to continue patch is aware of risks  Meds ordered this encounter  Medications   estradiol (DOTTI) 0.05 MG/24HR patch    Sig: APPLY ONE PATCH (0.'05MG'$  TOTAL) ONTO THE SKIN TWICE WEEKLY    Dispense:  24 patch    Refill:  4    Order Specific Question:   Supervising Provider    Answer:   Tania Ade H [[id number]]   nystatin (MYCOSTATIN/NYSTOP) powder    Sig: Apply 1 Application topically 3 (three) times daily.    Dispense:  60 g    Refill:  1     Order Specific Question:   Supervising Provider    Answer:   Florian Buff [[id number]]    Had negative mammogram 09/11/21.  2. Superficial fungus infection of skin, resolved Refilled nystatin powder She knows insurance will not cover   3. S/P hysterectomy    Plan:     Follow up in 1 year or sooner if needed

## 2022-07-11 ENCOUNTER — Ambulatory Visit
Admission: EM | Admit: 2022-07-11 | Discharge: 2022-07-11 | Disposition: A | Discharge status: Home / Self Care | Payer: Medicare PPO | Attending: Internal Medicine | Admitting: Internal Medicine

## 2022-07-11 ENCOUNTER — Encounter: Payer: Self-pay | Admitting: Emergency Medicine

## 2022-07-11 DIAGNOSIS — J449 Chronic obstructive pulmonary disease, unspecified: Secondary | ICD-10-CM | POA: Diagnosis not present

## 2022-07-11 DIAGNOSIS — Z1152 Encounter for screening for COVID-19: Secondary | ICD-10-CM | POA: Diagnosis not present

## 2022-07-11 DIAGNOSIS — R051 Acute cough: Secondary | ICD-10-CM | POA: Insufficient documentation

## 2022-07-11 DIAGNOSIS — Z87891 Personal history of nicotine dependence: Secondary | ICD-10-CM | POA: Insufficient documentation

## 2022-07-11 DIAGNOSIS — K219 Gastro-esophageal reflux disease without esophagitis: Secondary | ICD-10-CM | POA: Diagnosis not present

## 2022-07-11 DIAGNOSIS — E039 Hypothyroidism, unspecified: Secondary | ICD-10-CM | POA: Insufficient documentation

## 2022-07-11 DIAGNOSIS — E7800 Pure hypercholesterolemia, unspecified: Secondary | ICD-10-CM | POA: Diagnosis not present

## 2022-07-11 DIAGNOSIS — R0982 Postnasal drip: Secondary | ICD-10-CM | POA: Insufficient documentation

## 2022-07-11 LAB — POCT INFLUENZA A/B
Influenza A, POC: NEGATIVE
Influenza B, POC: NEGATIVE

## 2022-07-11 MED ORDER — BENZONATATE 200 MG PO CAPS: 200.0000 mg | ORAL_CAPSULE | Freq: Three times a day (TID) | ORAL | 0 refills | Status: DC | PRN

## 2022-07-11 MED ORDER — FLUTICASONE PROPIONATE 50 MCG/ACT NA SUSP: 2.0000 | Freq: Every day | NASAL | 0 refills | Status: DC

## 2022-07-11 NOTE — Discharge Instructions (Addendum)
Your Flu test is negative We will call you if the covid test is positive Try doing saline nose rinses twice a day for 5-7 days to help the post nasal drainage

## 2022-07-11 NOTE — ED Provider Notes (Signed)
[facility]    CSN: [id number] Arrival date & time: 07/11/22  1143      History   Chief Complaint No chief complaint on file.   HPI [name] is a 88 y.o. female who presents with onset of ST x 2 days. ST was yesterday when she swallowed, but today only hurts when she coughs. She coughed brown sputum this am only x 2 and none since. Has hx of COPD and had RUL pneumonectomy due to benign lung tumor. She states 3 days prior to onset of symptoms, a woman who was sitting behind her had a very congested sounding cough. She denies fever, chills, sweats. She is a little fatigued and does not feel herself. She did a home covid test today and was negative. Has taken 3 bottles of Mucinex, but has not tried any antihistamines     Past Medical History:  Diagnosis Date   Arthritis    hands    Atrial fibrillation and flutter    Bloody sputum    "started 12/2011" (03/18/2012)   Burning mouth syndrome    COPD (chronic obstructive pulmonary disease) ([place]) 02/12/2012   PFT 02/08/12>>FEV1 1.63 (95%), FEV1% 65, TLC 4.12 (94%), DLCO 74%, no BD    Current use of estrogen therapy 01/03/2015   Fibroid    GERD (gastroesophageal reflux disease)    H/O hiatal hernia    Heart murmur    02/2012 treadmill & echo, told that she has a "leak", but cleared for surgery     History of chronic bronchitis    "controlled after GERD controlled" (03/18/2012)   Hypercholesteremia    Hypothyroidism    Knee pain    Lesion of right lung    Lung mass    R upper lobe    Ocular migraine    "I have the flashing lights" (03/18/2012)   Plantar fascial fibromatosis    Post menopausal syndrome    Urinary incontinence    "slight bit" (03/18/2012)   Vaginal atrophy 07/02/2013   Vaginal discharge 02/14/2015    Patient Active Problem List   Diagnosis Date Noted   S/P hysterectomy 05/31/2022   Paroxysmal atrial fibrillation ([place]) 06/15/2021   Aortic stenosis 06/15/2021   Palpitations 05/11/2021   Skin  thickening 09/22/2020   Breast skin changes 09/22/2020   Dry skin dermatitis 09/15/2020   Superficial fungus infection of skin 06/30/2020   History of colonic polyps 06/07/2017   Vaginal discharge 02/14/2015   Current use of estrogen therapy 01/03/2015   Vaginal atrophy 07/02/2013   Rectocele 01/13/2013   S/P lobectomy of lung 03/18/2012   COPD (chronic obstructive pulmonary disease) ([place]) 02/12/2012   Coronary artery calcification seen on CAT scan 02/07/2012   Pulmonary nodule    Hypothyroidism 06/04/2011   Plantar fasciitis 06/04/2011   GERD (gastroesophageal reflux disease) 06/04/2011   IBS (irritable colon syndrome) 06/04/2011    Past Surgical History:  Procedure Laterality Date   ABDOMINAL HYSTERECTOMY  1980   BLADDER SUSPENSION  2007   Anterior repair   CATARACT EXTRACTION W/ INTRAOCULAR LENS  IMPLANT, BILATERAL  1990's   COLONOSCOPY     COLONOSCOPY N/A 09/12/2017   Procedure: COLONOSCOPY;  Surgeon: Rogene Houston, MD;  [facility];  Service: Endoscopy;  Laterality: N/A;  [place]  2007   EYE SURGERY     implants in both eyes    LOBECTOMY  03/13/2012   Procedure: LOBECTOMY;  Surgeon: Gaye Pollack, MD;  [facility];  Service: Thoracic;  Laterality: Right;  Right Thoracotomy, Right Upper Lobectomy, and lymphnode sampling   LYMPH NODE BIOPSY  03/13/2012   Procedure: LYMPH NODE BIOPSY;  Surgeon: Gaye Pollack, MD;  Location: [place];  Service: Thoracic;  Laterality: Right;  Right Thoracotomy, Right Upper Lobectomy, and lymphnode sampling   POLYPECTOMY  09/12/2017   Procedure: POLYPECTOMY;  Surgeon: Rogene Houston, MD;  [facility];  Service: Endoscopy;;  colon   SKIN CANCER EXCISION  05/2011   RUE "above elbow" (03/18/2012)   THORACOTOMY  03/13/2012   Procedure: THORACOTOMY MAJOR;  Surgeon: Gaye Pollack, MD;  Location: [place];  Service: Thoracic;  Laterality: Right;  Right Thoracotomy, Right Upper Lobectomy, and lymphnode sampling    UPPER GASTROINTESTINAL ENDOSCOPY     VIDEO BRONCHOSCOPY  03/13/2012   Procedure: VIDEO BRONCHOSCOPY;  Surgeon: Gaye Pollack, MD;  Location: [place];  Service: Thoracic;  Laterality: N/A;    OB History     Gravida  2   Para  2   Term  2   Preterm      AB      Living  2      SAB      IAB      Ectopic      Multiple      Live Births               Home Medications    Prior to Admission medications   Medication Sig Start Date End Date Taking? Authorizing Provider  benzonatate (TESSALON) 200 MG capsule Take 1 capsule (200 mg total) by mouth 3 (three) times daily as needed for cough. 07/11/22  Yes Rodriguez-Southworth, Sunday Spillers, PA-C  fluticasone (FLONASE) 50 MCG/ACT nasal spray Place 2 sprays into both nostrils daily. 07/11/22  Yes Rodriguez-Southworth, Sunday Spillers, PA-C  acetaminophen (TYLENOL) 500 MG tablet Take 1,000 mg by mouth every 6 (six) hours as needed for moderate pain or headache.     [provider]  apixaban (ELIQUIS) 5 MG TABS tablet Take 1 tablet (5 mg total) by mouth 2 (two) times daily. 06/15/21   Jerline Pain, MD  BIOTIN PO Take by mouth.    [provider]  cetirizine (ZYRTEC) 10 MG tablet Take 10 mg by mouth daily.    [provider]  Cholecalciferol (VITAMIN D3 PO) Take 1 capsule by mouth daily.    [provider]  diclofenac sodium (VOLTAREN) 1 % GEL Apply 1 application topically 3 (three) times daily as needed (for pain).    [provider]  diltiazem (CARDIZEM) 30 MG tablet Take 30 mg by mouth every 6 hours as needed for palpitations 11/29/21   Ahmed Prima, [place] M, PA-C  estradiol (DOTTI) 0.05 MG/24HR patch APPLY ONE PATCH (0.05MG TOTAL) ONTO THE SKIN TWICE WEEKLY 05/31/22   Estill Dooms, NP  levothyroxine (SYNTHROID, LEVOTHROID) 75 MCG tablet Take 75 mcg by mouth daily before breakfast.    [provider]  Multiple Vitamin (MULTIVITAMIN) capsule Take 1 capsule by mouth daily.    [provider]  NEXIUM 40 MG capsule TAKE ONE (1) CAPSULE BY MOUTH TWICE A DAY. (EVERY 12 HOURS.) Patient taking differently: generic 09/25/18   Setzer, Rona Ravens, NP  nystatin (MYCOSTATIN/NYSTOP) powder Apply 1 Application topically 3 (three) times daily. 05/31/22   Estill Dooms, NP  psyllium (METAMUCIL) 58.6 % packet Take 1 packet by mouth daily.    [provider]    Family History Family  History  Problem Relation Age of Onset   Uterine cancer Mother    Heart disease Mother    Osteoporosis Mother    Other Mother        abdominal aortic aneurysm/chronic lymphoid leukemia   Pancreatic cancer Father        pancreatic   Polycythemia Father    Multiple sclerosis Daughter    Cancer Maternal Aunt        Oral cancer    Social History Social History   Tobacco Use   Smoking status: Former    Packs/day: 2.00    Years: 10.00    Total pack years: 20.00    Types: Cigarettes    Quit date: 06/03/1976    Years since quitting: 46.1   Smokeless tobacco: Never  Vaping Use   Vaping Use: Never used  Substance Use Topics   Alcohol use: Yes    Alcohol/week: 2.0 standard drinks of alcohol    Types: 2 Glasses of wine per week    Comment: occ wine   Drug use: No     Allergies   Codeine and Other   Review of Systems Review of Systems  Constitutional:  Positive for fatigue. Negative for activity change, appetite change, chills, diaphoresis and fever.  HENT:  Positive for postnasal drip, sneezing and sore throat. Negative for ear discharge, ear pain, rhinorrhea, sinus pressure, sinus pain and trouble swallowing.   Eyes:  Negative for discharge.  Respiratory:  Positive for cough. Negative for chest tightness, shortness of breath and wheezing.   Cardiovascular:  Negative for chest pain.  Musculoskeletal:  Negative for myalgias.  Neurological:  Positive for dizziness. Negative for headaches.  Hematological:  Negative for adenopathy.     Physical Exam Triage Vital Signs ED  Triage Vitals  Enc Vitals Group     BP 07/11/22 1220 (!) 155/78     Pulse Rate 07/11/22 1220 75     Resp 07/11/22 1220 18     Temp 07/11/22 1220 98 F (36.7 C)     Temp Source 07/11/22 1220 Oral     SpO2 07/11/22 1220 95 %     Weight --      Height --      Head Circumference --      Peak Flow --      Pain Score 07/11/22 1222 3     Pain Loc --      Pain Edu? --      Excl. in [place]? --    No data found.  Updated Vital Signs BP (!) 155/78 (BP Location: Right Arm)   Pulse 75   Temp 98 F (36.7 C) (Oral)   Resp 18   SpO2 95%   Visual Acuity Right Eye Distance:   Left Eye Distance:   Bilateral Distance:    Right Eye Near:   Left Eye Near:    Bilateral Near:      Physical Exam Vitals signs and nursing note reviewed.  Constitutional:      General: She is not in acute distress.    Appearance: Normal appearance. She is not ill-appearing, toxic-appearing or diaphoretic.  HENT:     Head: Normocephalic.     Right Ear: Tympanic membrane, ear canal and external ear normal.     Left Ear: Tympanic membrane, ear canal and external ear normal.     Nose: Nose normal.     Mouth/Throat: mild erythema with white phlegm present.     Mouth: Mucous membranes are moist.  Eyes:     General: No scleral icterus.       Right eye: No discharge.        Left eye: No discharge.     Conjunctiva/sclera: Conjunctivae normal.  Neck:     Musculoskeletal: Neck supple. No neck rigidity.  Cardiovascular:     Rate and Rhythm: Normal rate and regular rhythm.     Heart sounds: No murmur.  Pulmonary:     Effort: Pulmonary effort is normal.     Breath sounds: Normal breath sounds.  Musculoskeletal: Normal range of motion.  Lymphadenopathy:     Cervical: No cervical adenopathy.  Skin:    General: Skin is warm and dry.     Coloration: Skin is not jaundiced.     Findings: No rash.  Neurological:     Mental Status: She is alert and oriented to person, place, and time.     Gait: Gait normal.   Psychiatric:        Mood and Affect: Mood normal.        Behavior: Behavior normal.        Thought Content: Thought content normal.        Judgment: Judgment normal.    UC Treatments / Results  Labs (all labs ordered are listed, but only abnormal results are displayed) Labs Reviewed  SARS CORONAVIRUS 2 (TAT 6-24 HRS)  POCT INFLUENZA A/B  Flu A&B negative  EKG   Radiology No results found.  Procedures Procedures (including critical care time)  Medications Ordered in UC Medications - No data to display  Initial Impression / Assessment and Plan / UC Course  I have reviewed the triage vital signs and the nursing notes.  Pertinent labs results that were available during my care of the patient were reviewed by me and considered in my medical decision making (see chart for details).  URI  Covid test is pending and we will call her when back. I placed her on Tessalon and Flonase as noted.    Final Clinical Impressions(s) / UC Diagnoses   Final diagnoses:  Acute cough  Post-nasal drainage     Discharge Instructions      Your Flu test is negative We will call you if the covid test is positive Try doing saline nose rinses twice a day for 5-7 days to help the post nasal drainage     ED Prescriptions     Medication Sig Dispense Auth. Provider   benzonatate (TESSALON) 200 MG capsule Take 1 capsule (200 mg total) by mouth 3 (three) times daily as needed for cough. 30 capsule Rodriguez-Southworth, Ariana Cavenaugh, PA-C   fluticasone (FLONASE) 50 MCG/ACT nasal spray Place 2 sprays into both nostrils daily. 16 g Rodriguez-Southworth, Sunday Spillers, PA-C      PDMP not reviewed this encounter.   Shelby Mattocks, [place] 07/11/22 1614

## 2022-07-11 NOTE — ED Triage Notes (Signed)
Sore throat on Monday, some coughing.  States she coughed up some brownish sputum this morning.

## 2022-07-12 LAB — SARS CORONAVIRUS 2 (TAT 6-24 HRS): SARS Coronavirus 2: NEGATIVE

## 2022-08-01 ENCOUNTER — Other Ambulatory Visit (HOSPITAL_COMMUNITY): Payer: Self-pay | Admitting: Internal Medicine

## 2022-08-01 DIAGNOSIS — Z1231 Encounter for screening mammogram for malignant neoplasm of breast: Secondary | ICD-10-CM

## 2022-08-06 ENCOUNTER — Ambulatory Visit
Admission: EM | Admit: 2022-08-06 | Discharge: 2022-08-06 | Disposition: A | Discharge status: Home / Self Care | Payer: Medicare PPO | Attending: Family Medicine | Admitting: Family Medicine

## 2022-08-06 DIAGNOSIS — R220 Localized swelling, mass and lump, head: Secondary | ICD-10-CM

## 2022-08-06 NOTE — ED Triage Notes (Signed)
Pt states she has a small knot on left side of face under the skin, under cheek bone that she noticed last week. Pt states it is not painful to touch.

## 2022-08-06 NOTE — Discharge Instructions (Signed)
If not allergic, you may use over the counter ibuprofen or acetaminophen as needed. ° °

## 2022-08-07 ENCOUNTER — Ambulatory Visit: Payer: Medicare PPO

## 2022-08-07 DIAGNOSIS — M79676 Pain in unspecified toe(s): Secondary | ICD-10-CM | POA: Diagnosis not present

## 2022-08-07 DIAGNOSIS — B351 Tinea unguium: Secondary | ICD-10-CM | POA: Diagnosis not present

## 2022-08-08 NOTE — ED Provider Notes (Signed)
[place]   [id number] 08/06/22 Arrival Time: 1202  ASSESSMENT & PLAN:  1. Facial mass    Feels like a cyst to me; very subtle. I do not think this is anything dangerous.  Recommend f/u with PCP to discuss imaging. OTC ibuprofen if this area becomes painful.  Will follow up with PCP or here if worsening or failing to improve as anticipated. Reviewed expectations re: course of current medical issues. Questions answered. Outlined signs and symptoms indicating need for more acute intervention. Patient verbalized understanding. After Visit Summary given.   SUBJECTIVE:  [name] is a 88 y.o. female who presents with a skin complaint. Pt states she has a small knot on left side of face under the skin, under cheek bone that she noticed last week. Pt states it is not painful to touch.  Denies trauma to area.  OBJECTIVE: Vitals:   08/06/22 1338  BP: 130/83  Pulse: 68  Resp: 16  Temp: 97.9 F (36.6 C)  TempSrc: Oral  SpO2: 96%    General appearance: alert; no distress HEENT: [place]; AT; very subtle <0.5 cm round sub-dermal firm mass noted over L superior cheek; no overlying skin abnormality or erythema Neck: supple with FROM; no LAD Extremities: no edema; moves all extremities normally Psychological: alert and cooperative; normal mood and affect  Allergies  Allergen Reactions   Codeine Nausea And Vomiting   Other Swelling and Other (See Comments)    Muscle Relaxers-weight gain    Past Medical History:  Diagnosis Date   Arthritis    hands    Atrial fibrillation and flutter    Bloody sputum    "started 12/2011" (03/18/2012)   Burning mouth syndrome    COPD (chronic obstructive pulmonary disease) ([place]) 02/12/2012   PFT 02/08/12>>FEV1 1.63 (95%), FEV1% 65, TLC 4.12 (94%), DLCO 74%, no BD    Current use of estrogen therapy 01/03/2015   Fibroid    GERD (gastroesophageal reflux disease)    H/O hiatal hernia    Heart murmur    02/2012 treadmill & echo, told  that she has a "leak", but cleared for surgery     History of chronic bronchitis    "controlled after GERD controlled" (03/18/2012)   Hypercholesteremia    Hypothyroidism    Knee pain    Lesion of right lung    Lung mass    R upper lobe    Ocular migraine    "I have the flashing lights" (03/18/2012)   Plantar fascial fibromatosis    Post menopausal syndrome    Urinary incontinence    "slight bit" (03/18/2012)   Vaginal atrophy 07/02/2013   Vaginal discharge 02/14/2015   Social History   Socioeconomic History   Marital status: Widowed    Spouse name: Not on file   Number of children: Not on file   Years of education: Not on file   Highest education level: Not on file  Occupational History   Occupation: retired    Fish farm manager: RETIRED  Tobacco Use   Smoking status: Former    Packs/day: 2.00    Years: 10.00    Total pack years: 20.00    Types: Cigarettes    Quit date: 06/03/1976    Years since quitting: 46.2   Smokeless tobacco: Never  Vaping Use   Vaping Use: Never used  Substance and Sexual Activity   Alcohol use: Yes    Alcohol/week: 2.0 standard drinks of alcohol    Types: 2 Glasses of wine per week  Comment: occ wine   Drug use: No   Sexual activity: Not Currently    Birth control/protection: Surgical    Comment: hyst  Other Topics Concern   Not on file  Social History Narrative   Not on file   Social Determinants of Health   Financial Resource Strain: Not on file  Food Insecurity: Not on file  Transportation Needs: Not on file  Physical Activity: Not on file  Stress: Not on file  Social Connections: Not on file  Intimate Partner Violence: Not on file   Family History  Problem Relation Age of Onset   Uterine cancer Mother    Heart disease Mother    Osteoporosis Mother    Other Mother        abdominal aortic aneurysm/chronic lymphoid leukemia   Pancreatic cancer Father        pancreatic   Polycythemia Father    Multiple sclerosis Daughter     Cancer Maternal Aunt        Oral cancer   Past Surgical History:  Procedure Laterality Date   ABDOMINAL HYSTERECTOMY  1980   BLADDER SUSPENSION  2007   Anterior repair   CATARACT EXTRACTION W/ INTRAOCULAR LENS  IMPLANT, BILATERAL  1990's   COLONOSCOPY     COLONOSCOPY N/A 09/12/2017   Procedure: COLONOSCOPY;  Surgeon: Rogene Houston, MD;  [facility];  Service: Endoscopy;  Laterality: N/A;  [place]  2007   EYE SURGERY     implants in both eyes    LOBECTOMY  03/13/2012   Procedure: LOBECTOMY;  Surgeon: Gaye Pollack, MD;  Location: [place];  Service: Thoracic;  Laterality: Right;  Right Thoracotomy, Right Upper Lobectomy, and lymphnode sampling   LYMPH NODE BIOPSY  03/13/2012   Procedure: LYMPH NODE BIOPSY;  Surgeon: Gaye Pollack, MD;  Location: [place];  Service: Thoracic;  Laterality: Right;  Right Thoracotomy, Right Upper Lobectomy, and lymphnode sampling   POLYPECTOMY  09/12/2017   Procedure: POLYPECTOMY;  Surgeon: Rogene Houston, MD;  [facility];  Service: Endoscopy;;  colon   SKIN CANCER EXCISION  05/2011   RUE "above elbow" (03/18/2012)   THORACOTOMY  03/13/2012   Procedure: THORACOTOMY MAJOR;  Surgeon: Gaye Pollack, MD;  Location: [place];  Service: Thoracic;  Laterality: Right;  Right Thoracotomy, Right Upper Lobectomy, and lymphnode sampling   UPPER GASTROINTESTINAL ENDOSCOPY     VIDEO BRONCHOSCOPY  03/13/2012   Procedure: VIDEO BRONCHOSCOPY;  Surgeon: Gaye Pollack, MD;  Location: [place];  Service: Thoracic;  Laterality: Ginette Pitman, MD 08/08/22 1447

## 2022-08-14 ENCOUNTER — Other Ambulatory Visit: Payer: Self-pay | Admitting: Cardiology

## 2022-08-14 NOTE — Telephone Encounter (Signed)
Prescription refill request for Eliquis received. Indication: PAF Last office visit: 11/29/21  B Strader PA-C Scr: 0.71 on 12/01/21 Age: 88 Weight: 64.4kg  Based on above findings Eliquis 5mg  twice daily is the appropriate dose.  Refill approved.

## 2022-08-20 DIAGNOSIS — E59 Dietary selenium deficiency: Secondary | ICD-10-CM | POA: Diagnosis not present

## 2022-09-11 DIAGNOSIS — R220 Localized swelling, mass and lump, head: Secondary | ICD-10-CM | POA: Diagnosis not present

## 2022-09-11 DIAGNOSIS — R051 Acute cough: Secondary | ICD-10-CM | POA: Diagnosis not present

## 2022-09-14 ENCOUNTER — Ambulatory Visit (HOSPITAL_COMMUNITY): Payer: Medicare PPO

## 2022-09-26 ENCOUNTER — Encounter (HOSPITAL_COMMUNITY): Payer: Self-pay

## 2022-09-26 ENCOUNTER — Ambulatory Visit (HOSPITAL_COMMUNITY)
Admission: RE | Admit: 2022-09-26 | Discharge: 2022-09-26 | Disposition: A | Discharge status: Home / Self Care | Payer: Medicare PPO | Source: Ambulatory Visit | Attending: Internal Medicine | Admitting: Internal Medicine

## 2022-09-26 DIAGNOSIS — Z1231 Encounter for screening mammogram for malignant neoplasm of breast: Secondary | ICD-10-CM | POA: Insufficient documentation

## 2022-10-24 DIAGNOSIS — R220 Localized swelling, mass and lump, head: Secondary | ICD-10-CM | POA: Diagnosis not present

## 2022-11-08 DIAGNOSIS — M79676 Pain in unspecified toe(s): Secondary | ICD-10-CM | POA: Diagnosis not present

## 2022-11-08 DIAGNOSIS — B351 Tinea unguium: Secondary | ICD-10-CM | POA: Diagnosis not present

## 2022-11-21 ENCOUNTER — Institutional Professional Consult (permissible substitution): Payer: Medicare PPO | Admitting: Plastic Surgery

## 2022-11-23 ENCOUNTER — Ambulatory Visit: Payer: Medicare PPO | Attending: Internal Medicine | Admitting: Internal Medicine

## 2022-11-23 ENCOUNTER — Encounter: Payer: Self-pay | Admitting: Internal Medicine

## 2022-11-23 VITALS — BP 122/64 | HR 70 | Ht 60.0 in | Wt 141.4 lb

## 2022-11-23 DIAGNOSIS — I350 Nonrheumatic aortic (valve) stenosis: Secondary | ICD-10-CM

## 2022-11-23 DIAGNOSIS — I480 Paroxysmal atrial fibrillation: Secondary | ICD-10-CM

## 2022-11-23 MED ORDER — DILTIAZEM HCL 30 MG PO TABS: ORAL_TABLET | ORAL | 3 refills | Status: DC

## 2022-11-23 MED ORDER — APIXABAN 5 MG PO TABS: ORAL_TABLET | ORAL | 1 refills | Status: DC

## 2022-11-23 NOTE — Progress Notes (Signed)
Cardiology Office Note  Date: 11/23/2022   ID: [name], DOB 03/01/1935, MRN [id number]  PCP:  Carylon Perches, MD  Cardiologist:  Donato Schultz, MD Electrophysiologist:  None   Reason for Office Visit: Follow-up of A-fib   History of Present Illness: [name] is a 88 y.o. female known to have paroxysmal A-fib, mild aortic valve stenosis, or lung mass s/p RUL lobectomy in 2013, hypothyroidism, COPD, GERD is here for follow-up visit.  Event monitor from 05/2021 showed 2% A-fib burden after which she was started on Eliquis for [facility] and short acting Cardizem for as needed palpitations.  Echo from 2021 showed normal LVEF, G1 DD and mild aortic valve stenosis. She is here for follow-up visit.  Has palpitations lasting for a few seconds once per week and a few times per month.  She takes diltiazem as needed.  Currently on Eliquis 5 mg twice daily.  No bleeding complications.  No chest pain, DOE, orthopnea, PND, dizziness, syncope.  Overall doing great.  Past Medical History:  Diagnosis Date   Arthritis    hands    Atrial fibrillation and flutter    Bloody sputum    "started 12/2011" (03/18/2012)   Burning mouth syndrome    COPD (chronic obstructive pulmonary disease) (HCC) 02/12/2012   PFT 02/08/12>>FEV1 1.63 (95%), FEV1% 65, TLC 4.12 (94%), DLCO 74%, no BD    Current use of estrogen therapy 01/03/2015   Fibroid    GERD (gastroesophageal reflux disease)    H/O hiatal hernia    Heart murmur    02/2012 treadmill & echo, told that she has a "leak", but cleared for surgery     History of chronic bronchitis    "controlled after GERD controlled" (03/18/2012)   Hypercholesteremia    Hypothyroidism    Knee pain    Lesion of right lung    Lung mass    R upper lobe    Ocular migraine    "I have the flashing lights" (03/18/2012)   Plantar fascial fibromatosis    Post menopausal syndrome    Urinary incontinence    "slight bit" (03/18/2012)   Vaginal atrophy 07/02/2013   Vaginal discharge  02/14/2015    Past Surgical History:  Procedure Laterality Date   ABDOMINAL HYSTERECTOMY  1980   BLADDER SUSPENSION  2007   Anterior repair   CATARACT EXTRACTION W/ INTRAOCULAR LENS  IMPLANT, BILATERAL  1990's   COLONOSCOPY     COLONOSCOPY N/A 09/12/2017   Procedure: COLONOSCOPY;  Surgeon: Malissa Hippo, MD;  [facility];  Service: Endoscopy;  Laterality: N/A;  830   CYSTOCELE REPAIR  2007   EYE SURGERY     implants in both eyes    LOBECTOMY  03/13/2012   Procedure: LOBECTOMY;  Surgeon: [name], MD;  [facility]: [facility];  Service: Thoracic;  Laterality: Right;  Right Thoracotomy, Right Upper Lobectomy, and lymphnode sampling   LYMPH NODE BIOPSY  03/13/2012   Procedure: LYMPH NODE BIOPSY;  Surgeon: [name], MD;  [facility]: [facility];  Service: Thoracic;  Laterality: Right;  Right Thoracotomy, Right Upper Lobectomy, and lymphnode sampling   POLYPECTOMY  09/12/2017   Procedure: POLYPECTOMY;  Surgeon: Malissa Hippo, MD;  [facility];  Service: Endoscopy;;  colon   SKIN CANCER EXCISION  05/2011   RUE "above elbow" (03/18/2012)   THORACOTOMY  03/13/2012   Procedure: THORACOTOMY MAJOR;  Surgeon: [name], MD;  [facility]: [facility];  Service: Thoracic;  Laterality: Right;  Right Thoracotomy, Right Upper Lobectomy, and lymphnode sampling   UPPER GASTROINTESTINAL ENDOSCOPY     VIDEO BRONCHOSCOPY  03/13/2012   Procedure: VIDEO BRONCHOSCOPY;  Surgeon: [name], MD;  [facility]: [facility];  Service: Thoracic;  Laterality: N/A;    Current Outpatient Medications  Medication Sig Dispense Refill   acetaminophen (TYLENOL) 500 MG tablet Take 1,000 mg by mouth every 6 (six) hours as needed for moderate pain or headache.      apixaban (ELIQUIS) 5 MG TABS tablet TAKE ONE TABLET (5MG  TOTAL) BY MOUTH TWOTIMES DAILY 180 tablet 1   BIOTIN PO Take by mouth.     cetirizine (ZYRTEC) 10 MG tablet Take 10 mg by mouth daily.     Cholecalciferol (VITAMIN D3 PO) Take 1 capsule by  mouth daily.     diclofenac sodium (VOLTAREN) 1 % GEL Apply 1 application topically 3 (three) times daily as needed (for pain).     diltiazem (CARDIZEM) 30 MG tablet Take 30 mg by mouth every 6 hours as needed for palpitations 90 tablet 3   estradiol (DOTTI) 0.05 MG/24HR patch APPLY ONE PATCH (0.05MG  TOTAL) ONTO THE SKIN TWICE WEEKLY 24 patch 4   fluticasone (FLONASE) 50 MCG/ACT nasal spray Place 2 sprays into both nostrils daily. 16 g 0   levothyroxine (SYNTHROID, LEVOTHROID) 75 MCG tablet Take 75 mcg by mouth daily before breakfast.     Multiple Vitamin (MULTIVITAMIN) capsule Take 1 capsule by mouth daily.     NEXIUM 40 MG capsule TAKE ONE (1) CAPSULE BY MOUTH TWICE A DAY. (EVERY 12 HOURS.) (Patient taking differently: generic) 180 capsule 3   nystatin (MYCOSTATIN/NYSTOP) powder Apply 1 Application topically 3 (three) times daily. 60 g 1   psyllium (METAMUCIL) 58.6 % packet Take 1 packet by mouth daily.     No current facility-administered medications for this visit.   Allergies:  Codeine and Other   Social History: The patient  reports that she quit smoking about 46 years ago. Her smoking use included cigarettes. She has a 20.00 pack-year smoking history. She has never used smokeless tobacco. She reports current alcohol use of about 2.0 standard drinks of alcohol per week. She reports that she does not use drugs.   Family History: The patient's family history includes Cancer in her maternal aunt; Heart disease in her mother; Multiple sclerosis in her daughter; Osteoporosis in her mother; Other in her mother; Pancreatic cancer in her father; Polycythemia in her father; Uterine cancer in her mother.   ROS:  Please see the history of present illness. Otherwise, complete review of systems is positive for none.  All other systems are reviewed and negative.   Physical Exam: VS:  BP 122/64   Pulse 70   Ht 5' (1.524 m)   Wt 141 lb 6.4 oz (64.1 kg)   SpO2 94%   BMI 27.62 kg/m , BMI Body mass  index is 27.62 kg/m.  Wt Readings from Last 3 Encounters:  11/23/22 141 lb 6.4 oz (64.1 kg)  05/31/22 143 lb 8 oz (65.1 kg)  11/29/21 142 lb (64.4 kg)    General: Patient appears comfortable at rest. HEENT: Conjunctiva and lids normal, oropharynx clear with moist mucosa. Neck: Supple, no elevated JVP or carotid bruits, no thyromegaly. Lungs: Clear to auscultation, nonlabored breathing at rest. Cardiac: Regular rate and rhythm, no S3 or significant systolic murmur, no pericardial rub. Abdomen: Soft, nontender, no hepatomegaly, bowel sounds present, no guarding or rebound. Extremities: No pitting edema, distal pulses  2+. Skin: Warm and dry. Musculoskeletal: No kyphosis. Neuropsychiatric: Alert and oriented x3, affect grossly appropriate.  Recent Labwork: 12/01/2021: BUN 11; Creatinine, Ser 0.71; Hemoglobin 14.9; Platelets 212; Potassium 4.5; Sodium 139  No results found for: "CHOL", "TRIG", "HDL", "CHOLHDL", "VLDL", "LDLCALC", "LDLDIRECT"  Other Studies Reviewed Today:   Assessment and Plan:  # Paroxysmal A-fib -EKG today in the clinic showed NSR -Continue p.o. diltiazem 30 mg every 6 hours as needed palpitations -Continue Eliquis 5 mg twice daily  # Mild aortic valve stenosis in 2021 -Obtain 2D echocardiogram  I have spent a total of 30 minutes with patient reviewing chart, EKGs, labs and examining patient as well as establishing an assessment and plan that was discussed with the patient.  > 50% of time was spent in direct patient care.    Medication Adjustments/Labs and Tests Ordered: Current medicines are reviewed at length with the patient today.  Concerns regarding medicines are outlined above.   Tests Ordered: Orders Placed This Encounter  Procedures   EKG 12-Lead   EKG 12-Lead   ECHOCARDIOGRAM COMPLETE    Medication Changes: No orders of the defined types were placed in this encounter.   Disposition:  Follow up  1 year  Signed, Aaditya Letizia Verne Spurr,  MD, 11/23/2022 2:06 PM    [facility] at [facility] [address], [place], [place] [address]

## 2022-11-23 NOTE — Patient Instructions (Addendum)
Medication Instructions:  Your physician recommends that you continue on your current medications as directed. Please refer to the Current Medication list given to you today.  Labwork: none  Testing/Procedures: Your physician has requested that you have an echocardiogram. Echocardiography is a painless test that uses sound waves to create images of your heart. It provides your doctor with information about the size and shape of your heart and how well your heart's chambers and valves are working. This procedure takes approximately one hour. There are no restrictions for this procedure. Please do NOT wear cologne, perfume, aftershave, or lotions (deodorant is allowed). Please arrive 15 minutes prior to your appointment time.  Follow-Up: Your physician recommends that you schedule a follow-up appointment in: 1 year. You will receive a reminder call in the mail in about 10 months reminding you to call and schedule your appointment. If you don't receive this call, please contact our office.  Any Other Special Instructions Will Be Listed Below (If Applicable).  If you need a refill on your cardiac medications before your next appointment, please call your pharmacy. 

## 2022-11-30 ENCOUNTER — Ambulatory Visit: Payer: Medicare PPO | Admitting: Internal Medicine

## 2022-12-19 DIAGNOSIS — L821 Other seborrheic keratosis: Secondary | ICD-10-CM | POA: Diagnosis not present

## 2022-12-19 DIAGNOSIS — D485 Neoplasm of uncertain behavior of skin: Secondary | ICD-10-CM | POA: Diagnosis not present

## 2022-12-26 ENCOUNTER — Encounter: Payer: Self-pay | Admitting: Plastic Surgery

## 2022-12-26 ENCOUNTER — Ambulatory Visit: Payer: Medicare PPO | Admitting: Plastic Surgery

## 2022-12-26 VITALS — BP 129/74 | HR 82 | Ht 60.0 in | Wt 145.2 lb

## 2022-12-26 DIAGNOSIS — L989 Disorder of the skin and subcutaneous tissue, unspecified: Secondary | ICD-10-CM

## 2022-12-26 NOTE — Progress Notes (Signed)
Referring Provider Carylon Perches, MD [address] [place],  [place] [address]   CC:  Chief Complaint  Patient presents with   Consult      [name] is an 88 y.o. female.  HPI: [name] is an 88 year old female who presents today with complaints of a mass in her left cheek which she noted in May.  She has not noticed any increase in size since that time nor she had any pain or drainage from the site.  Allergies  Allergen Reactions   Codeine Nausea And Vomiting   Other Swelling and Other (See Comments)    Muscle Relaxers-weight gain    Outpatient Encounter Medications as of 12/26/2022  Medication Sig   acetaminophen (TYLENOL) 500 MG tablet Take 1,000 mg by mouth every 6 (six) hours as needed for moderate pain or headache.    apixaban (ELIQUIS) 5 MG TABS tablet TAKE ONE TABLET (5MG  TOTAL) BY MOUTH TWOTIMES DAILY   BIOTIN PO Take by mouth.   cetirizine (ZYRTEC) 10 MG tablet Take 10 mg by mouth daily.   Cholecalciferol (VITAMIN D3 PO) Take 1 capsule by mouth daily.   diclofenac sodium (VOLTAREN) 1 % GEL Apply 1 application topically 3 (three) times daily as needed (for pain).   diltiazem (CARDIZEM) 30 MG tablet Take 30 mg by mouth every 6 hours as needed for palpitations   estradiol (DOTTI) 0.05 MG/24HR patch APPLY ONE PATCH (0.05MG  TOTAL) ONTO THE SKIN TWICE WEEKLY   levothyroxine (SYNTHROID, LEVOTHROID) 75 MCG tablet Take 75 mcg by mouth daily before breakfast.   Multiple Vitamin (MULTIVITAMIN) capsule Take 1 capsule by mouth daily.   NEXIUM 40 MG capsule TAKE ONE (1) CAPSULE BY MOUTH TWICE A DAY. (EVERY 12 HOURS.) (Patient taking differently: generic)   nystatin (MYCOSTATIN/NYSTOP) powder Apply 1 Application topically 3 (three) times daily.   psyllium (METAMUCIL) 58.6 % packet Take 1 packet by mouth daily.   fluticasone (FLONASE) 50 MCG/ACT nasal spray Place 2 sprays into both nostrils daily.   No facility-administered encounter medications on file as of 12/26/2022.      Past Medical History:  Diagnosis Date   Arthritis    hands    Atrial fibrillation and flutter    Bloody sputum    "started 12/2011" (03/18/2012)   Burning mouth syndrome    COPD (chronic obstructive pulmonary disease) (HCC) 02/12/2012   PFT 02/08/12>>FEV1 1.63 (95%), FEV1% 65, TLC 4.12 (94%), DLCO 74%, no BD    Current use of estrogen therapy 01/03/2015   Fibroid    GERD (gastroesophageal reflux disease)    H/O hiatal hernia    Heart murmur    02/2012 treadmill & echo, told that she has a "leak", but cleared for surgery     History of chronic bronchitis    "controlled after GERD controlled" (03/18/2012)   Hypercholesteremia    Hypothyroidism    Knee pain    Lesion of right lung    Lung mass    R upper lobe    Ocular migraine    "I have the flashing lights" (03/18/2012)   Plantar fascial fibromatosis    Post menopausal syndrome    Urinary incontinence    "slight bit" (03/18/2012)   Vaginal atrophy 07/02/2013   Vaginal discharge 02/14/2015    Past Surgical History:  Procedure Laterality Date   ABDOMINAL HYSTERECTOMY  1980   BLADDER SUSPENSION  2007   Anterior repair   CATARACT EXTRACTION W/ INTRAOCULAR LENS  IMPLANT, BILATERAL  1990's  COLONOSCOPY     COLONOSCOPY N/A 09/12/2017   Procedure: COLONOSCOPY;  Surgeon: Malissa Hippo, MD;  [facility];  Service: Endoscopy;  Laterality: N/A;  830   CYSTOCELE REPAIR  2007   EYE SURGERY     implants in both eyes    LOBECTOMY  03/13/2012   Procedure: LOBECTOMY;  Surgeon: [name], MD;  [facility]: [facility];  Service: Thoracic;  Laterality: Right;  Right Thoracotomy, Right Upper Lobectomy, and lymphnode sampling   LYMPH NODE BIOPSY  03/13/2012   Procedure: LYMPH NODE BIOPSY;  Surgeon: [name], MD;  [facility];  Service: Thoracic;  Laterality: Right;  Right Thoracotomy, Right Upper Lobectomy, and lymphnode sampling   POLYPECTOMY  09/12/2017   Procedure: POLYPECTOMY;  Surgeon: Malissa Hippo, MD;   [facility];  Service: Endoscopy;;  colon   SKIN CANCER EXCISION  05/2011   RUE "above elbow" (03/18/2012)   THORACOTOMY  03/13/2012   Procedure: THORACOTOMY MAJOR;  Surgeon: [name], MD;  [facility];  Service: Thoracic;  Laterality: Right;  Right Thoracotomy, Right Upper Lobectomy, and lymphnode sampling   UPPER GASTROINTESTINAL ENDOSCOPY     VIDEO BRONCHOSCOPY  03/13/2012   Procedure: VIDEO BRONCHOSCOPY;  Surgeon: [name], MD;  [facility]: [facility];  Service: Thoracic;  Laterality: N/A;    Family History  Problem Relation Age of Onset   Uterine cancer Mother    Heart disease Mother    Osteoporosis Mother    Other Mother        abdominal aortic aneurysm/chronic lymphoid leukemia   Pancreatic cancer Father        pancreatic   Polycythemia Father    Multiple sclerosis Daughter    Cancer Maternal Aunt        Oral cancer    Social History   Social History Narrative   Not on file     Review of Systems General: Denies fevers, chills, weight loss CV: Denies chest pain, shortness of breath, palpitations Skin: Subcutaneous mass left cheek  Physical Exam    12/26/2022    1:51 PM 11/23/2022    1:51 PM 08/06/2022    1:38 PM  Vitals with BMI  Height 5\' 0"  5\' 0"    Weight 145 lbs 3 oz 141 lbs 6 oz   BMI 28.36 27.62   Systolic 129 122 595  Diastolic 74 64 83  Pulse 82 70 68    General:  No acute distress,  Alert and oriented, Non-Toxic, Normal speech and affect Integument: There is a sub-1 cm palpable mass under the skin on the left cheek.  This appears to be an epidermal inclusion cyst. Mammogram: Not applicable Assessment/Plan Skin lesion: Most likely an epidermal inclusion cyst.  We discussed epidermal inclusion cyst and the benign nature of these lesions.  The patient is currently on Eliquis for atrial fibrillation and I am a little hesitant to perform excision of the cyst in the office.  We did discuss the indications for removing this which would be  if the cyst increases in size, if she has any evidence of infection, or if it just continues to bother her from a psychological standpoint.  If if she begins to experience any of these she will return to see me and we will make arrangements to remove the cyst.  She may return at any time for discussion.  Santiago Glad 12/26/2022, 2:25 PM

## 2023-01-03 ENCOUNTER — Other Ambulatory Visit (HOSPITAL_COMMUNITY): Payer: Medicare PPO

## 2023-01-04 ENCOUNTER — Ambulatory Visit (HOSPITAL_COMMUNITY)
Admission: RE | Admit: 2023-01-04 | Discharge: 2023-01-04 | Disposition: A | Discharge status: Home / Self Care | Payer: Medicare PPO | Source: Ambulatory Visit | Attending: Internal Medicine | Admitting: Internal Medicine

## 2023-01-04 DIAGNOSIS — I350 Nonrheumatic aortic (valve) stenosis: Secondary | ICD-10-CM | POA: Insufficient documentation

## 2023-02-14 DIAGNOSIS — B351 Tinea unguium: Secondary | ICD-10-CM | POA: Diagnosis not present

## 2023-02-14 DIAGNOSIS — M79676 Pain in unspecified toe(s): Secondary | ICD-10-CM | POA: Diagnosis not present

## 2023-02-27 DIAGNOSIS — H43393 Other vitreous opacities, bilateral: Secondary | ICD-10-CM | POA: Diagnosis not present

## 2023-03-27 DIAGNOSIS — Z23 Encounter for immunization: Secondary | ICD-10-CM | POA: Diagnosis not present

## 2023-04-29 DIAGNOSIS — K219 Gastro-esophageal reflux disease without esophagitis: Secondary | ICD-10-CM | POA: Diagnosis not present

## 2023-04-29 DIAGNOSIS — Z79899 Other long term (current) drug therapy: Secondary | ICD-10-CM | POA: Diagnosis not present

## 2023-04-29 DIAGNOSIS — I350 Nonrheumatic aortic (valve) stenosis: Secondary | ICD-10-CM | POA: Diagnosis not present

## 2023-04-29 DIAGNOSIS — E039 Hypothyroidism, unspecified: Secondary | ICD-10-CM | POA: Diagnosis not present

## 2023-04-29 DIAGNOSIS — I700 Atherosclerosis of aorta: Secondary | ICD-10-CM | POA: Diagnosis not present

## 2023-04-29 DIAGNOSIS — I480 Paroxysmal atrial fibrillation: Secondary | ICD-10-CM | POA: Diagnosis not present

## 2023-05-06 DIAGNOSIS — E039 Hypothyroidism, unspecified: Secondary | ICD-10-CM | POA: Diagnosis not present

## 2023-05-06 DIAGNOSIS — K219 Gastro-esophageal reflux disease without esophagitis: Secondary | ICD-10-CM | POA: Diagnosis not present

## 2023-05-06 DIAGNOSIS — I700 Atherosclerosis of aorta: Secondary | ICD-10-CM | POA: Diagnosis not present

## 2023-05-06 DIAGNOSIS — Z0001 Encounter for general adult medical examination with abnormal findings: Secondary | ICD-10-CM | POA: Diagnosis not present

## 2023-05-06 DIAGNOSIS — I350 Nonrheumatic aortic (valve) stenosis: Secondary | ICD-10-CM | POA: Diagnosis not present

## 2023-05-06 DIAGNOSIS — I480 Paroxysmal atrial fibrillation: Secondary | ICD-10-CM | POA: Diagnosis not present

## 2023-06-20 DIAGNOSIS — B351 Tinea unguium: Secondary | ICD-10-CM | POA: Diagnosis not present

## 2023-06-20 DIAGNOSIS — M79676 Pain in unspecified toe(s): Secondary | ICD-10-CM | POA: Diagnosis not present

## 2023-06-25 ENCOUNTER — Ambulatory Visit: Payer: Medicare PPO | Admitting: Adult Health

## 2023-06-25 ENCOUNTER — Encounter: Payer: Self-pay | Admitting: Adult Health

## 2023-06-25 VITALS — BP 123/74 | HR 83 | Ht 60.0 in | Wt 143.5 lb

## 2023-06-25 DIAGNOSIS — Z79818 Long term (current) use of other agents affecting estrogen receptors and estrogen levels: Secondary | ICD-10-CM

## 2023-06-25 DIAGNOSIS — Z79899 Other long term (current) drug therapy: Secondary | ICD-10-CM | POA: Diagnosis not present

## 2023-06-25 DIAGNOSIS — B369 Superficial mycosis, unspecified: Secondary | ICD-10-CM

## 2023-06-25 DIAGNOSIS — Z90710 Acquired absence of both cervix and uterus: Secondary | ICD-10-CM | POA: Diagnosis not present

## 2023-06-25 MED ORDER — NYSTATIN 100000 UNIT/GM EX POWD: 1.0000 | Freq: Three times a day (TID) | CUTANEOUS | 1 refills | Status: AC

## 2023-06-25 MED ORDER — ESTRADIOL 0.05 MG/24HR TD PTTW: MEDICATED_PATCH | TRANSDERMAL | 4 refills | Status: AC

## 2023-06-25 NOTE — Progress Notes (Signed)
  Subjective:     Patient ID: [name], female   DOB: [birth year], 89 y.o.   MRN: [id number]  HPI [name] is an 89 year old white female, widowed,sp hysterectomy in requesting refills on Vivelle dot patch and nystatin powders. She is doing well and had physical with Dr Ouida Sills in December.  PCP is Dr Ouida Sills  Review of Systems Not having hot flashes at night now No current skin fungus Reviewed past medical,surgical, social and family history. Reviewed medications and allergies.     Objective:   Physical Exam BP 123/74 (BP Location: Left Arm, Patient Position: Sitting, Cuff Size: Normal)   Pulse 83   Ht 5' (1.524 m)   Wt 143 lb 8 oz (65.1 kg)   BMI 28.03 kg/m     Skin warm and dry. Lungs: clear to ausculation bilaterally. Cardiovascular: regular rate and rhythm.  Fall risk is low  Upstream - 06/25/23 1411       Pregnancy Intention Screening   Does the patient want to become pregnant in the next year? N/A    Does the patient's partner want to become pregnant in the next year? N/A    Would the patient like to discuss contraceptive options today? N/A      Contraception Wrap Up   Current Method Abstinence;Female Sterilization   hyst   End Method Abstinence;Female Sterilization   hyst   Contraception Counseling Provided No             Assessment:     1. Current use of estrogen therapy (Primary) No hot flashes at night Will refill Vivelle dot 0.05 mg twice weekly, she is aware of risks and benefits Meds ordered this encounter  Medications   estradiol (DOTTI) 0.05 MG/24HR patch    Sig: APPLY ONE PATCH (0.05MG  TOTAL) ONTO THE SKIN TWICE WEEKLY    Dispense:  24 patch    Refill:  4    Supervising Provider:   Duane Lope H [[id number]]   nystatin (MYCOSTATIN/NYSTOP) powder    Sig: Apply 1 Application topically 3 (three) times daily.    Dispense:  60 g    Refill:  1    Supervising Provider:   Duane Lope H [[id number]]     2. Superficial fungus infection of skin None currently,  will refill nystatin powders   3. S/P hysterectomy     Plan:     Follow up in 1 year

## 2023-08-10 ENCOUNTER — Other Ambulatory Visit: Payer: Self-pay | Admitting: Internal Medicine

## 2023-08-10 DIAGNOSIS — I480 Paroxysmal atrial fibrillation: Secondary | ICD-10-CM

## 2023-08-12 NOTE — Telephone Encounter (Signed)
 Prescription refill request for Eliquis received. Indication: PAF Last office visit: 11/23/22  Lenord Carbo MD Scr:0.78 on 04/29/23  [facility] Age: 89 Weight: 64.1kg  Based on above findings Eliquis 5mg  twice daily is the appropriate dose.  Refill approved.

## 2023-08-20 ENCOUNTER — Other Ambulatory Visit (HOSPITAL_COMMUNITY): Payer: Self-pay | Admitting: Internal Medicine

## 2023-08-20 DIAGNOSIS — Z1231 Encounter for screening mammogram for malignant neoplasm of breast: Secondary | ICD-10-CM

## 2023-09-19 DIAGNOSIS — M79673 Pain in unspecified foot: Secondary | ICD-10-CM | POA: Diagnosis not present

## 2023-09-19 DIAGNOSIS — D2372 Other benign neoplasm of skin of left lower limb, including hip: Secondary | ICD-10-CM | POA: Diagnosis not present

## 2023-09-19 DIAGNOSIS — D2371 Other benign neoplasm of skin of right lower limb, including hip: Secondary | ICD-10-CM | POA: Diagnosis not present

## 2023-09-30 ENCOUNTER — Ambulatory Visit (HOSPITAL_COMMUNITY)
Admission: RE | Admit: 2023-09-30 | Discharge: 2023-09-30 | Disposition: A | Discharge status: Home / Self Care | Source: Ambulatory Visit | Attending: Internal Medicine | Admitting: Internal Medicine

## 2023-09-30 ENCOUNTER — Encounter (HOSPITAL_COMMUNITY): Payer: Self-pay

## 2023-09-30 DIAGNOSIS — Z1231 Encounter for screening mammogram for malignant neoplasm of breast: Secondary | ICD-10-CM | POA: Diagnosis not present

## 2023-11-01 ENCOUNTER — Ambulatory Visit: Payer: Medicare PPO | Admitting: Internal Medicine

## 2023-11-12 ENCOUNTER — Encounter: Payer: Self-pay | Admitting: Internal Medicine

## 2023-11-12 ENCOUNTER — Ambulatory Visit: Attending: Internal Medicine | Admitting: Internal Medicine

## 2023-11-12 DIAGNOSIS — I480 Paroxysmal atrial fibrillation: Secondary | ICD-10-CM

## 2023-11-12 DIAGNOSIS — L570 Actinic keratosis: Secondary | ICD-10-CM | POA: Diagnosis not present

## 2023-11-12 DIAGNOSIS — I350 Nonrheumatic aortic (valve) stenosis: Secondary | ICD-10-CM | POA: Diagnosis not present

## 2023-11-12 DIAGNOSIS — B079 Viral wart, unspecified: Secondary | ICD-10-CM | POA: Diagnosis not present

## 2023-11-12 NOTE — Progress Notes (Signed)
 Cardiology Office Note  Date: 11/12/2023   ID: [name], DOB 05/01/1935, MRN [id number]  PCP:  Artemisa Bile, MD  Cardiologist:  Lasalle Pointer, MD Electrophysiologist:  None   Reason for Office Visit: Follow-up of A-fib and AS.   History of Present Illness: [name] is a 89 y.o. female known to have paroxysmal A-fib, mild aortic valve stenosis, or lung mass s/p RUL lobectomy in 2013, hypothyroidism, COPD, GERD is here for follow-up visit.  Event monitor from 05/2021 showed 2% A-fib burden after which she was started on Eliquis  for [facility] and short acting Cardizem  for as needed palpitations.  Echo from 2021 showed normal LVEF, G1 DD and mild aortic valve stenosis (V-max 1.9 mL/s and mean PG 9 mmHg).  Repeat echocardiogram in 2024 showed normal LVEF, no evidence of aortic valve stenosis.  She had aortic valve V-max 1.9 m/s and mean PG 9 mmHg in 2024 as well.  She is here for follow-up visit.    Patient did notice palpitations lasting for a few minutes and had to take diltiazem  as needed.  Never lasted more than 30 minutes.  He does not have any symptoms of angina or DOE.  Doing great overall. Compliant with medications and has no side effects.  No bleeding complications.  Past Medical History:  Diagnosis Date   Arthritis    hands    Atrial fibrillation and flutter    Bloody sputum    started 12/2011 (03/18/2012)   Burning mouth syndrome    COPD (chronic obstructive pulmonary disease) (HCC) 02/12/2012   PFT 02/08/12>>FEV1 1.63 (95%), FEV1% 65, TLC 4.12 (94%), DLCO 74%, no BD    Current use of estrogen therapy 01/03/2015   Fibroid    GERD (gastroesophageal reflux disease)    H/O hiatal hernia    Heart murmur    02/2012 treadmill & echo, told that she has a leak, but cleared for surgery     History of chronic bronchitis    controlled after GERD controlled (03/18/2012)   Hypercholesteremia    Hypothyroidism    Knee pain    Lesion of right lung    Lung mass    R  upper lobe    Ocular migraine    I have the flashing lights (03/18/2012)   Plantar fascial fibromatosis    Post menopausal syndrome    Urinary incontinence    slight bit (03/18/2012)   Vaginal atrophy 07/02/2013   Vaginal discharge 02/14/2015    Past Surgical History:  Procedure Laterality Date   ABDOMINAL HYSTERECTOMY  1980   BLADDER SUSPENSION  2007   Anterior repair   CATARACT EXTRACTION W/ INTRAOCULAR LENS  IMPLANT, BILATERAL  1990's   COLONOSCOPY     COLONOSCOPY N/A 09/12/2017   Procedure: COLONOSCOPY;  Surgeon: Ruby Corporal, MD;  [facility]: [facility];  Service: Endoscopy;  Laterality: N/A;  830   CYSTOCELE REPAIR  2007   EYE SURGERY     implants in both eyes    LOBECTOMY  03/13/2012   Procedure: LOBECTOMY;  Surgeon: Bartley Lightning, MD;  Location: [facility] OR;  Service: Thoracic;  Laterality: Right;  Right Thoracotomy, Right Upper Lobectomy, and lymphnode sampling   LYMPH NODE BIOPSY  03/13/2012   Procedure: LYMPH NODE BIOPSY;  Surgeon: Bartley Lightning, MD;  Location: [facility] OR;  Service: Thoracic;  Laterality: Right;  Right Thoracotomy, Right Upper Lobectomy, and lymphnode sampling   POLYPECTOMY  09/12/2017   Procedure: POLYPECTOMY;  Surgeon: Ruby Corporal,  MD;  Location: [facility];  Service: Endoscopy;;  colon   SKIN CANCER EXCISION  05/2011   RUE above elbow (03/18/2012)   THORACOTOMY  03/13/2012   Procedure: THORACOTOMY MAJOR;  Surgeon: Bartley Lightning, MD;  Location: [facility] OR;  Service: Thoracic;  Laterality: Right;  Right Thoracotomy, Right Upper Lobectomy, and lymphnode sampling   UPPER GASTROINTESTINAL ENDOSCOPY     VIDEO BRONCHOSCOPY  03/13/2012   Procedure: VIDEO BRONCHOSCOPY;  Surgeon: Bartley Lightning, MD;  Location: [facility] OR;  Service: Thoracic;  Laterality: N/A;    Current Outpatient Medications  Medication Sig Dispense Refill   acetaminophen  (TYLENOL ) 500 MG tablet Take 1,000 mg by mouth every 6 (six) hours as needed for moderate pain or headache.      BIOTIN PO  Take by mouth.     cetirizine (ZYRTEC) 10 MG tablet Take 10 mg by mouth daily.     Cholecalciferol (VITAMIN D3 PO) Take 1 capsule by mouth daily.     diclofenac sodium (VOLTAREN) 1 % GEL Apply 1 application topically 3 (three) times daily as needed (for pain).     diltiazem  (CARDIZEM ) 30 MG tablet Take 30 mg by mouth every 6 hours as needed for palpitations 90 tablet 3   ELIQUIS  5 MG TABS tablet TAKE ONE TABLET BY MOUTH TWICE A DAY 180 tablet 1   estradiol  (DOTTI ) 0.05 MG/24HR patch APPLY ONE PATCH (0.05MG  TOTAL) ONTO THE SKIN TWICE WEEKLY 24 patch 4   levothyroxine  (SYNTHROID , LEVOTHROID) 75 MCG tablet Take 75 mcg by mouth daily before breakfast.     Multiple Vitamin (MULTIVITAMIN) capsule Take 1 capsule by mouth daily.     NEXIUM  40 MG capsule TAKE ONE (1) CAPSULE BY MOUTH TWICE A DAY. (EVERY 12 HOURS.) (Patient taking differently: generic) 180 capsule 3   nystatin  (MYCOSTATIN /NYSTOP ) powder Apply 1 Application topically 3 (three) times daily. 60 g 1   psyllium (METAMUCIL) 58.6 % packet Take 1 packet by mouth daily.     No current facility-administered medications for this visit.   Allergies:  Codeine and Other   Social History: The patient  reports that she quit smoking about 47 years ago. Her smoking use included cigarettes. She started smoking about 57 years ago. She has a 20 pack-year smoking history. She has never used smokeless tobacco. She reports current alcohol use of about 2.0 standard drinks of alcohol per week. She reports that she does not use drugs.   Family History: The patient's family history includes Cancer in her maternal aunt; Heart disease in her mother; Multiple sclerosis in her daughter; Osteoporosis in her mother; Other in her mother; Pancreatic cancer in her father; Polycythemia in her father; Uterine cancer in her mother.   ROS:  Please see the history of present illness. Otherwise, complete review of systems is positive for none.  All other systems are reviewed and  negative.   Physical Exam: VS:  BP 108/68   Pulse 68   Ht 5' (1.524 m)   Wt 143 lb 3.2 oz (65 kg)   SpO2 96%   BMI 27.97 kg/m , BMI Body mass index is 27.97 kg/m.  Wt Readings from Last 3 Encounters:  11/12/23 143 lb 3.2 oz (65 kg)  06/25/23 143 lb 8 oz (65.1 kg)  12/26/22 145 lb 3.2 oz (65.9 kg)    General: Patient appears comfortable at rest. HEENT: Conjunctiva and lids normal, oropharynx clear with moist mucosa. Neck: Supple, no elevated JVP or carotid bruits, no thyromegaly. Lungs: Clear to  auscultation, nonlabored breathing at rest. Cardiac: Regular rate and rhythm, no S3 or significant systolic murmur, no pericardial rub. Abdomen: Soft, nontender, no hepatomegaly, bowel sounds present, no guarding or rebound. Extremities: No pitting edema, distal pulses 2+. Skin: Warm and dry. Musculoskeletal: No kyphosis. Neuropsychiatric: Alert and oriented x3, affect grossly appropriate.  Recent Labwork: No results found for requested labs within last 365 days.  No results found for: CHOL, TRIG, HDL, CHOLHDL, VLDL, LDLCALC, LDLDIRECT   Assessment and Plan:  # Paroxysmal A-fib - EKG today showed NSR.  She has occasional palpitations lasting for minutes and needing to take diltiazem  as needed for palpitations.  Continue diltiazem  30 mg daily as needed for palpitations. - Continue Eliquis  5 mg twice daily.  # Mild aortic valve stenosis in 2021 - Aortic valve V-max was 1.9 m/s with mean PG 9 mmHg in 2021.  She had a similar V-max and mean PG in 2024 as well.  No evidence of aortic stenosis.  No indication to repeat echocardiogram unless she develops new symptoms.   Medication Adjustments/Labs and Tests Ordered: Current medicines are reviewed at length with the patient today.  Concerns regarding medicines are outlined above.   Tests Ordered: Orders Placed This Encounter  Procedures   EKG 12-Lead    Medication Changes: No orders of the defined types were placed in  this encounter.   Disposition:  Follow up 2 year or sooner if any new issues  Signed, Caterin Tabares Beauford Bounds, MD, 11/12/2023 3:07 PM    [facility] at [facility] [address], [place], [place] [address]

## 2023-11-12 NOTE — Patient Instructions (Signed)
 Medication Instructions:  Your physician recommends that you continue on your current medications as directed. Please refer to the Current Medication list given to you today.  *If you need a refill on your cardiac medications before your next appointment, please call your pharmacy*  Lab Work: None If you have labs (blood work) drawn today and your tests are completely normal, you will receive your results only by: MyChart Message (if you have MyChart) OR A paper copy in the mail If you have any lab test that is abnormal or we need to change your treatment, we will call you to review the results.  Testing/Procedures: None  Follow-Up: At [facility], you and your health needs are our priority.  As part of our continuing mission to provide you with exceptional heart care, our providers are all part of one team.  This team includes your primary Cardiologist (physician) and Advanced Practice Providers or APPs (Physician Assistants and Nurse Practitioners) who all work together to provide you with the care you need, when you need it.  Your next appointment:   2 year(s)  Provider:   You may see Vishnu P Mallipeddi, MD or one of the following Advanced Practice Providers on your designated Care Team:   [place], PA-C  Scotesia Weott, [place] Theotis Flake, [place]     We recommend signing up for the patient portal called MyChart.  Sign up information is provided on this After Visit Summary.  MyChart is used to connect with patients for Virtual Visits (Telemedicine).  Patients are able to view lab/test results, encounter notes, upcoming appointments, etc.  Non-urgent messages can be sent to your provider as well.   To learn more about what you can do with MyChart, go to [email or web address].   Other Instructions

## 2023-12-17 DIAGNOSIS — D2372 Other benign neoplasm of skin of left lower limb, including hip: Secondary | ICD-10-CM | POA: Diagnosis not present

## 2023-12-17 DIAGNOSIS — D2371 Other benign neoplasm of skin of right lower limb, including hip: Secondary | ICD-10-CM | POA: Diagnosis not present

## 2024-02-03 ENCOUNTER — Other Ambulatory Visit: Payer: Self-pay | Admitting: Internal Medicine

## 2024-02-03 DIAGNOSIS — I480 Paroxysmal atrial fibrillation: Secondary | ICD-10-CM

## 2024-02-04 NOTE — Telephone Encounter (Signed)
 Prescription refill request for Eliquis  received. Indication: a fib Last office visit: 11/12/23 Scr: 0.78 [facility] 04/29/23 Age: 89 Weight: 65kg

## 2024-03-04 DIAGNOSIS — H43393 Other vitreous opacities, bilateral: Secondary | ICD-10-CM | POA: Diagnosis not present

## 2024-03-10 DIAGNOSIS — B351 Tinea unguium: Secondary | ICD-10-CM | POA: Diagnosis not present

## 2024-03-10 DIAGNOSIS — M79675 Pain in left toe(s): Secondary | ICD-10-CM | POA: Diagnosis not present

## 2024-03-10 DIAGNOSIS — M79674 Pain in right toe(s): Secondary | ICD-10-CM | POA: Diagnosis not present

## 2024-03-20 DIAGNOSIS — Z23 Encounter for immunization: Secondary | ICD-10-CM | POA: Diagnosis not present

## 2024-03-23 DIAGNOSIS — B079 Viral wart, unspecified: Secondary | ICD-10-CM | POA: Diagnosis not present

## 2024-04-13 ENCOUNTER — Other Ambulatory Visit: Payer: Self-pay | Admitting: Internal Medicine

## 2024-04-13 DIAGNOSIS — I480 Paroxysmal atrial fibrillation: Secondary | ICD-10-CM
